# Patient Record
Sex: Female | Born: 1985 | Race: White | Hispanic: Yes | Marital: Married | Smoking: Never smoker
Health system: Southern US, Community
[De-identification: ages and names within clinical notes are randomized; demographics above are authoritative.]

## PROBLEM LIST (undated history)

## (undated) ENCOUNTER — Inpatient Hospital Stay (HOSPITAL_COMMUNITY): Payer: Self-pay

## (undated) DIAGNOSIS — O039 Complete or unspecified spontaneous abortion without complication: Secondary | ICD-10-CM

## (undated) DIAGNOSIS — R519 Headache, unspecified: Secondary | ICD-10-CM

## (undated) DIAGNOSIS — B999 Unspecified infectious disease: Secondary | ICD-10-CM

## (undated) DIAGNOSIS — Z1589 Genetic susceptibility to other disease: Secondary | ICD-10-CM

## (undated) DIAGNOSIS — R011 Cardiac murmur, unspecified: Secondary | ICD-10-CM

## (undated) DIAGNOSIS — R51 Headache: Secondary | ICD-10-CM

## (undated) DIAGNOSIS — J40 Bronchitis, not specified as acute or chronic: Secondary | ICD-10-CM

## (undated) DIAGNOSIS — E039 Hypothyroidism, unspecified: Secondary | ICD-10-CM

## (undated) DIAGNOSIS — J45909 Unspecified asthma, uncomplicated: Secondary | ICD-10-CM

## (undated) DIAGNOSIS — D649 Anemia, unspecified: Secondary | ICD-10-CM

## (undated) HISTORY — PX: WISDOM TOOTH EXTRACTION: SHX21

## (undated) HISTORY — DX: Hypothyroidism, unspecified: E03.9

---

## 2011-03-19 ENCOUNTER — Ambulatory Visit (INDEPENDENT_AMBULATORY_CARE_PROVIDER_SITE_OTHER): Payer: BC Managed Care – PPO

## 2011-03-19 DIAGNOSIS — J209 Acute bronchitis, unspecified: Secondary | ICD-10-CM

## 2011-03-19 DIAGNOSIS — R05 Cough: Secondary | ICD-10-CM

## 2011-03-19 DIAGNOSIS — M7150 Other bursitis, not elsewhere classified, unspecified site: Secondary | ICD-10-CM

## 2011-03-19 DIAGNOSIS — R059 Cough, unspecified: Secondary | ICD-10-CM

## 2011-03-19 DIAGNOSIS — M25539 Pain in unspecified wrist: Secondary | ICD-10-CM

## 2012-03-07 NOTE — L&D Delivery Note (Signed)
SVD of VMI APGARs 9, 9.  EBL: 400cc.  Placenta to L&D.   Head delivered LOA and body delivered atraumatically.  Mouth and nose bulb suctioned.  Cord clamped, cut and baby to abdomen.  Cord pH obtained.  Placenta delivered S/I/3VC.  Fundus firmed with pitocin and massage.  Bilateral vaginal lacerations repaired with 3-0 Vicryl in the normal fashion.  Mom and baby stable.

## 2012-05-07 LAB — OB RESULTS CONSOLE HIV ANTIBODY (ROUTINE TESTING): HIV: NONREACTIVE

## 2012-05-07 LAB — OB RESULTS CONSOLE RPR: RPR: NONREACTIVE

## 2012-05-07 LAB — OB RESULTS CONSOLE RUBELLA ANTIBODY, IGM: Rubella: IMMUNE

## 2012-05-21 LAB — OB RESULTS CONSOLE GC/CHLAMYDIA: Gonorrhea: NEGATIVE

## 2012-07-12 ENCOUNTER — Ambulatory Visit: Payer: BC Managed Care – PPO | Attending: Obstetrics and Gynecology | Admitting: Physical Therapy

## 2012-07-12 DIAGNOSIS — M545 Low back pain, unspecified: Secondary | ICD-10-CM | POA: Insufficient documentation

## 2012-07-12 DIAGNOSIS — M899 Disorder of bone, unspecified: Secondary | ICD-10-CM | POA: Insufficient documentation

## 2012-07-12 DIAGNOSIS — IMO0001 Reserved for inherently not codable concepts without codable children: Secondary | ICD-10-CM | POA: Insufficient documentation

## 2012-07-12 DIAGNOSIS — M25559 Pain in unspecified hip: Secondary | ICD-10-CM | POA: Insufficient documentation

## 2012-07-12 DIAGNOSIS — O99891 Other specified diseases and conditions complicating pregnancy: Secondary | ICD-10-CM | POA: Insufficient documentation

## 2012-07-12 DIAGNOSIS — M259 Joint disorder, unspecified: Secondary | ICD-10-CM | POA: Insufficient documentation

## 2012-07-18 ENCOUNTER — Ambulatory Visit: Payer: BC Managed Care – PPO | Admitting: Physical Therapy

## 2012-07-23 ENCOUNTER — Ambulatory Visit: Payer: BC Managed Care – PPO | Admitting: Physical Therapy

## 2012-08-02 ENCOUNTER — Ambulatory Visit: Payer: BC Managed Care – PPO | Admitting: Physical Therapy

## 2012-08-03 ENCOUNTER — Encounter: Payer: BC Managed Care – PPO | Admitting: Physical Therapy

## 2012-08-07 ENCOUNTER — Ambulatory Visit: Payer: BC Managed Care – PPO | Attending: Obstetrics and Gynecology | Admitting: Physical Therapy

## 2012-08-07 DIAGNOSIS — M259 Joint disorder, unspecified: Secondary | ICD-10-CM | POA: Insufficient documentation

## 2012-08-07 DIAGNOSIS — IMO0001 Reserved for inherently not codable concepts without codable children: Secondary | ICD-10-CM | POA: Insufficient documentation

## 2012-08-07 DIAGNOSIS — M25559 Pain in unspecified hip: Secondary | ICD-10-CM | POA: Insufficient documentation

## 2012-08-07 DIAGNOSIS — M545 Low back pain, unspecified: Secondary | ICD-10-CM | POA: Insufficient documentation

## 2012-08-07 DIAGNOSIS — M899 Disorder of bone, unspecified: Secondary | ICD-10-CM | POA: Insufficient documentation

## 2012-08-08 ENCOUNTER — Encounter: Payer: BC Managed Care – PPO | Admitting: Physical Therapy

## 2012-08-15 ENCOUNTER — Ambulatory Visit: Payer: BC Managed Care – PPO | Admitting: Physical Therapy

## 2012-11-16 LAB — OB RESULTS CONSOLE GBS: GBS: NEGATIVE

## 2012-12-16 ENCOUNTER — Inpatient Hospital Stay (HOSPITAL_COMMUNITY)
Admission: AD | Admit: 2012-12-16 | Discharge: 2012-12-19 | DRG: 373 | Disposition: A | Discharge status: Home / Self Care | Payer: BC Managed Care – PPO | Source: Ambulatory Visit | Attending: Obstetrics and Gynecology | Admitting: Obstetrics and Gynecology

## 2012-12-16 ENCOUNTER — Inpatient Hospital Stay (HOSPITAL_COMMUNITY)
Admission: AD | Admit: 2012-12-16 | Discharge: 2012-12-16 | Disposition: A | Discharge status: Home / Self Care | Payer: BC Managed Care – PPO | Source: Ambulatory Visit | Attending: Obstetrics and Gynecology | Admitting: Obstetrics and Gynecology

## 2012-12-16 ENCOUNTER — Encounter (HOSPITAL_COMMUNITY): Payer: Self-pay

## 2012-12-16 ENCOUNTER — Encounter (HOSPITAL_COMMUNITY): Payer: Self-pay | Admitting: *Deleted

## 2012-12-16 DIAGNOSIS — O479 False labor, unspecified: Secondary | ICD-10-CM | POA: Insufficient documentation

## 2012-12-16 HISTORY — DX: Bronchitis, not specified as acute or chronic: J40

## 2012-12-16 HISTORY — DX: Unspecified asthma, uncomplicated: J45.909

## 2012-12-16 HISTORY — DX: Cardiac murmur, unspecified: R01.1

## 2012-12-16 LAB — CBC
MCH: 30.2 pg (ref 26.0–34.0)
MCHC: 34.7 g/dL (ref 30.0–36.0)
MCV: 87 fL (ref 78.0–100.0)
Platelets: 176 10*3/uL (ref 150–400)
RBC: 4.24 MIL/uL (ref 3.87–5.11)
RDW: 13 % (ref 11.5–15.5)
WBC: 13 10*3/uL — ABNORMAL HIGH (ref 4.0–10.5)

## 2012-12-16 MED ORDER — ACETAMINOPHEN 325 MG PO TABS: 650.0000 mg | ORAL_TABLET | ORAL | Status: DC | PRN

## 2012-12-16 MED ORDER — CITRIC ACID-SODIUM CITRATE 334-500 MG/5ML PO SOLN: 30.0000 mL | ORAL | Status: DC | PRN

## 2012-12-16 MED ORDER — LACTATED RINGERS IV SOLN
INTRAVENOUS | Status: DC
  Administered 2012-12-17 (×2): 125 mL/h via INTRAVENOUS

## 2012-12-16 MED ORDER — OXYCODONE-ACETAMINOPHEN 5-325 MG PO TABS: 1.0000 | ORAL_TABLET | ORAL | Status: DC | PRN

## 2012-12-16 MED ORDER — OXYTOCIN BOLUS FROM INFUSION: 500.0000 mL | INTRAVENOUS | Status: DC

## 2012-12-16 MED ORDER — IBUPROFEN 600 MG PO TABS: 600.0000 mg | ORAL_TABLET | Freq: Four times a day (QID) | ORAL | Status: DC | PRN

## 2012-12-16 MED ORDER — OXYTOCIN 40 UNITS IN LACTATED RINGERS INFUSION - SIMPLE MED
62.5000 mL/h | INTRAVENOUS | Status: DC
  Administered 2012-12-17 (×2): 62.5 mL/h via INTRAVENOUS
  Filled 2012-12-16: qty 1000

## 2012-12-16 MED ORDER — ONDANSETRON HCL 4 MG/2ML IJ SOLN
4.0000 mg | Freq: Four times a day (QID) | INTRAMUSCULAR | Status: DC | PRN
  Administered 2012-12-16: 4 mg via INTRAVENOUS
  Filled 2012-12-16: qty 2

## 2012-12-16 MED ORDER — LACTATED RINGERS IV SOLN
500.0000 mL | INTRAVENOUS | Status: DC | PRN
  Administered 2012-12-16: 500 mL via INTRAVENOUS
  Administered 2012-12-17: 300 mL via INTRAVENOUS

## 2012-12-16 MED ORDER — BUTORPHANOL TARTRATE 1 MG/ML IJ SOLN
1.0000 mg | INTRAMUSCULAR | Status: DC | PRN
  Administered 2012-12-16: 1 mg via INTRAVENOUS
  Filled 2012-12-16: qty 1

## 2012-12-16 MED ORDER — LIDOCAINE HCL (PF) 1 % IJ SOLN: 30.0000 mL | INTRAMUSCULAR | Status: DC | PRN

## 2012-12-17 ENCOUNTER — Encounter (HOSPITAL_COMMUNITY): Payer: Self-pay | Admitting: Anesthesiology

## 2012-12-17 ENCOUNTER — Inpatient Hospital Stay (HOSPITAL_COMMUNITY): Payer: BC Managed Care – PPO | Admitting: Anesthesiology

## 2012-12-17 ENCOUNTER — Encounter (HOSPITAL_COMMUNITY): Payer: BC Managed Care – PPO | Admitting: Anesthesiology

## 2012-12-17 LAB — RPR: RPR Ser Ql: NONREACTIVE

## 2012-12-17 MED ORDER — ZOLPIDEM TARTRATE 5 MG PO TABS: 5.0000 mg | ORAL_TABLET | Freq: Every evening | ORAL | Status: DC | PRN

## 2012-12-17 MED ORDER — LANOLIN HYDROUS EX OINT: TOPICAL_OINTMENT | CUTANEOUS | Status: DC | PRN

## 2012-12-17 MED ORDER — OXYCODONE-ACETAMINOPHEN 5-325 MG PO TABS
1.0000 | ORAL_TABLET | ORAL | Status: DC | PRN
  Administered 2012-12-18 – 2012-12-19 (×3): 1 via ORAL
  Filled 2012-12-17 (×3): qty 1

## 2012-12-17 MED ORDER — DIBUCAINE 1 % RE OINT
1.0000 "application " | TOPICAL_OINTMENT | RECTAL | Status: DC | PRN
  Filled 2012-12-17: qty 28

## 2012-12-17 MED ORDER — FENTANYL 2.5 MCG/ML BUPIVACAINE 1/10 % EPIDURAL INFUSION (WH - ANES)
14.0000 mL/h | INTRAMUSCULAR | Status: DC | PRN
  Administered 2012-12-17: 14 mL/h via EPIDURAL
  Filled 2012-12-17 (×2): qty 125

## 2012-12-17 MED ORDER — SENNOSIDES-DOCUSATE SODIUM 8.6-50 MG PO TABS
2.0000 | ORAL_TABLET | ORAL | Status: DC
  Administered 2012-12-17 – 2012-12-18 (×2): 2 via ORAL
  Filled 2012-12-17 (×2): qty 2

## 2012-12-17 MED ORDER — IBUPROFEN 600 MG PO TABS
600.0000 mg | ORAL_TABLET | Freq: Four times a day (QID) | ORAL | Status: DC
  Administered 2012-12-17 – 2012-12-19 (×7): 600 mg via ORAL
  Filled 2012-12-17 (×7): qty 1

## 2012-12-17 MED ORDER — BENZOCAINE-MENTHOL 20-0.5 % EX AERO
1.0000 "application " | INHALATION_SPRAY | CUTANEOUS | Status: DC | PRN
  Administered 2012-12-17: 1 via TOPICAL
  Filled 2012-12-17 (×2): qty 56

## 2012-12-17 MED ORDER — PHENYLEPHRINE 40 MCG/ML (10ML) SYRINGE FOR IV PUSH (FOR BLOOD PRESSURE SUPPORT): 80.0000 ug | PREFILLED_SYRINGE | INTRAVENOUS | Status: DC | PRN

## 2012-12-17 MED ORDER — FENTANYL 2.5 MCG/ML BUPIVACAINE 1/10 % EPIDURAL INFUSION (WH - ANES)
INTRAMUSCULAR | Status: DC | PRN
  Administered 2012-12-17: 14 mL/h via EPIDURAL

## 2012-12-17 MED ORDER — PHENYLEPHRINE 40 MCG/ML (10ML) SYRINGE FOR IV PUSH (FOR BLOOD PRESSURE SUPPORT)
80.0000 ug | PREFILLED_SYRINGE | INTRAVENOUS | Status: DC | PRN
  Filled 2012-12-17: qty 5

## 2012-12-17 MED ORDER — EPHEDRINE 5 MG/ML INJ
10.0000 mg | INTRAVENOUS | Status: AC | PRN
  Administered 2012-12-17 (×2): 10 mg via INTRAVENOUS

## 2012-12-17 MED ORDER — DIPHENHYDRAMINE HCL 50 MG/ML IJ SOLN: 12.5000 mg | INTRAMUSCULAR | Status: DC | PRN

## 2012-12-17 MED ORDER — ONDANSETRON HCL 4 MG PO TABS: 4.0000 mg | ORAL_TABLET | ORAL | Status: DC | PRN

## 2012-12-17 MED ORDER — DIPHENHYDRAMINE HCL 25 MG PO CAPS: 25.0000 mg | ORAL_CAPSULE | Freq: Four times a day (QID) | ORAL | Status: DC | PRN

## 2012-12-17 MED ORDER — SIMETHICONE 80 MG PO CHEW: 80.0000 mg | CHEWABLE_TABLET | ORAL | Status: DC | PRN

## 2012-12-17 MED ORDER — TETANUS-DIPHTH-ACELL PERTUSSIS 5-2.5-18.5 LF-MCG/0.5 IM SUSP: 0.5000 mL | Freq: Once | INTRAMUSCULAR | Status: DC

## 2012-12-17 MED ORDER — WITCH HAZEL-GLYCERIN EX PADS: 1.0000 "application " | MEDICATED_PAD | CUTANEOUS | Status: DC | PRN

## 2012-12-17 MED ORDER — LACTATED RINGERS IV SOLN
500.0000 mL | Freq: Once | INTRAVENOUS | Status: AC
  Administered 2012-12-17: 500 mL via INTRAVENOUS

## 2012-12-17 MED ORDER — EPHEDRINE 5 MG/ML INJ
10.0000 mg | INTRAVENOUS | Status: DC | PRN
  Filled 2012-12-17 (×2): qty 4

## 2012-12-17 MED ORDER — ONDANSETRON HCL 4 MG/2ML IJ SOLN: 4.0000 mg | INTRAMUSCULAR | Status: DC | PRN

## 2012-12-17 MED ORDER — PRENATAL MULTIVITAMIN CH
1.0000 | ORAL_TABLET | Freq: Every day | ORAL | Status: DC
  Administered 2012-12-17 – 2012-12-18 (×2): 1 via ORAL
  Filled 2012-12-17 (×2): qty 1

## 2012-12-17 MED ORDER — LIDOCAINE HCL (PF) 1 % IJ SOLN
INTRAMUSCULAR | Status: DC | PRN
  Administered 2012-12-17 (×2): 9 mL

## 2012-12-17 NOTE — H&P (Signed)
Wendy Osborne is a 27 y.o. female presenting for labor. History OB History   Grav Para Term Preterm Abortions TAB SAB Ect Mult Living   1              Past Medical History  Diagnosis Date  . Bronchitis   . Heart murmur     middle school heart murmer noted, gone now  . Asthma     inhaler prn   Past Surgical History  Procedure Laterality Date  . Wisdom tooth extraction     Family History: family history includes Cancer in her brother and father. Social History:  reports that she has never smoked. She does not have any smokeless tobacco history on file. She reports that she does not drink alcohol or use illicit drugs.   Prenatal Transfer Tool  Maternal Diabetes: No Genetic Screening: Normal Maternal Ultrasounds/Referrals: Normal Fetal Ultrasounds or other Referrals:  None Maternal Substance Abuse:  No Significant Maternal Medications:  None Significant Maternal Lab Results:  None Other Comments:  None  ROS  Dilation: 3 Effacement (%): 90 Station: 0 Exam by:: T. Sprague RN Blood pressure 108/58, pulse 71, temperature 98.4 F (36.9 C), temperature source Oral, resp. rate 18, height 4\' 11"  (1.499 m), weight 71.668 kg (158 lb), SpO2 100.00%. Exam Physical Exam  Gen - uncomfortable w/ ctx Abd - gravid, NT Ext - NT Cvx 3/90/-1 Prenatal labs: ABO, Rh: O/Positive/-- (03/03 0000) Antibody: Negative (03/03 0000) Rubella: Immune (03/03 0000) RPR: Nonreactive (03/03 0000)  HBsAg: Negative (03/03 0000)  HIV: Non-reactive (03/03 0000)  GBS: Negative (09/12 0000)   Assessment/Plan: Labor Epidural prn   Kyran Connaughton 12/17/2012, 1:57 AM

## 2012-12-17 NOTE — Anesthesia Postprocedure Evaluation (Signed)
  Anesthesia Post-op Note  Patient: Wendy Osborne  Procedure(s) Performed: * No procedures listed *  Patient Location: Mother/Baby  Anesthesia Type:Epidural  Level of Consciousness: awake, alert  and oriented  Airway and Oxygen Therapy: Patient Spontanous Breathing  Post-op Pain: none  Post-op Assessment: Post-op Vital signs reviewed and Patient's Cardiovascular Status Stable  Post-op Vital Signs: Reviewed and stable  Complications: No apparent anesthesia complications

## 2012-12-17 NOTE — Progress Notes (Signed)
Pt comfortable w/ epidural  FHT reassuring, cat 1 Toco Q2-4 Cvx 6/C/-1-0 AROM, clear  A/P:  Exp mngt

## 2012-12-17 NOTE — Anesthesia Preprocedure Evaluation (Signed)
Anesthesia Evaluation  Patient identified by MRN, date of birth, ID band Patient awake    Reviewed: Allergy & Precautions, H&P , Patient's Chart, lab work & pertinent test results  Airway Mallampati: II TM Distance: >3 FB Neck ROM: full    Dental no notable dental hx.    Pulmonary    Pulmonary exam normal       Cardiovascular negative cardio ROS      Neuro/Psych negative neurological ROS  negative psych ROS   GI/Hepatic negative GI ROS, Neg liver ROS,   Endo/Other  negative endocrine ROS  Renal/GU negative Renal ROS  negative genitourinary   Musculoskeletal negative musculoskeletal ROS (+)   Abdominal Normal abdominal exam  (+)   Peds  Hematology negative hematology ROS (+)   Anesthesia Other Findings   Reproductive/Obstetrics (+) Pregnancy                           Anesthesia Physical Anesthesia Plan  ASA: II  Anesthesia Plan: Epidural   Post-op Pain Management:    Induction:   Airway Management Planned:   Additional Equipment:   Intra-op Plan:   Post-operative Plan:   Informed Consent: I have reviewed the patients History and Physical, chart, labs and discussed the procedure including the risks, benefits and alternatives for the proposed anesthesia with the patient or authorized representative who has indicated his/her understanding and acceptance.     Plan Discussed with:   Anesthesia Plan Comments:         Anesthesia Quick Evaluation

## 2012-12-17 NOTE — Anesthesia Procedure Notes (Signed)
Epidural Patient location during procedure: OB Start time: 12/17/2012 12:32 AM End time: 12/17/2012 12:36 AM  Staffing Anesthesiologist: Sandrea Hughs Performed by: anesthesiologist   Preanesthetic Checklist Completed: patient identified, surgical consent, pre-op evaluation, timeout performed, IV checked, risks and benefits discussed and monitors and equipment checked  Epidural Patient position: sitting Prep: site prepped and draped and DuraPrep Patient monitoring: continuous pulse ox and blood pressure Approach: midline Injection technique: LOR air  Needle:  Needle type: Tuohy  Needle gauge: 17 G Needle length: 9 cm and 9 Needle insertion depth: 5 cm cm Catheter type: closed end flexible Catheter size: 19 Gauge Catheter at skin depth: 10 cm Test dose: negative and Other  Assessment Sensory level: T9 Events: blood not aspirated, injection not painful, no injection resistance, negative IV test and no paresthesia  Additional Notes Reason for block:procedure for pain

## 2012-12-18 LAB — CBC
HCT: 25.8 % — ABNORMAL LOW (ref 36.0–46.0)
Hemoglobin: 9.2 g/dL — ABNORMAL LOW (ref 12.0–15.0)
MCH: 31.6 pg (ref 26.0–34.0)
MCHC: 35.7 g/dL (ref 30.0–36.0)
MCV: 88.7 fL (ref 78.0–100.0)
Platelets: 135 K/uL — ABNORMAL LOW (ref 150–400)
RBC: 2.91 MIL/uL — ABNORMAL LOW (ref 3.87–5.11)
RDW: 13.3 % (ref 11.5–15.5)
WBC: 11.2 K/uL — ABNORMAL HIGH (ref 4.0–10.5)

## 2012-12-18 NOTE — Progress Notes (Signed)
Post Partum Day 1 Subjective: no complaints, up ad lib, voiding, tolerating PO and + flatus  Objective: Blood pressure 85/44, pulse 81, temperature 98.6 F (37 C), temperature source Oral, resp. rate 18, height 4\' 11"  (1.499 m), weight 158 lb (71.668 kg), SpO2 99.00%, unknown if currently breastfeeding.  Physical Exam:  General: alert and cooperative Lochia: appropriate Uterine Fundus: firm Incision: perineum intact DVT Evaluation: No evidence of DVT seen on physical exam. Negative Homan's sign. No cords or calf tenderness. No significant calf/ankle edema.   Recent Labs  12/16/12 2200 12/18/12 0610  HGB 12.8 9.2*  HCT 36.9 25.8*    Assessment/Plan: Plan for discharge tomorrow and Circumcision prior to discharge   LOS: 2 days   Charma Mocarski G 12/18/2012, 8:05 AM

## 2012-12-19 MED ORDER — OXYCODONE-ACETAMINOPHEN 5-325 MG PO TABS: 1.0000 | ORAL_TABLET | ORAL | Status: DC | PRN

## 2012-12-19 MED ORDER — IBUPROFEN 600 MG PO TABS: 600.0000 mg | ORAL_TABLET | Freq: Four times a day (QID) | ORAL | Status: DC

## 2012-12-19 NOTE — Discharge Summary (Signed)
Obstetric Discharge Summary Reason for Admission: onset of labor Prenatal Procedures: ultrasound Intrapartum Procedures: spontaneous vaginal delivery Postpartum Procedures: none Complications-Operative and Postpartum: vaginal laceration Hemoglobin  Date Value Range Status  12/18/2012 9.2* 12.0 - 15.0 g/dL Final     DELTA CHECK NOTED     REPEATED TO VERIFY     HCT  Date Value Range Status  12/18/2012 25.8* 36.0 - 46.0 % Final    Physical Exam:  General: alert and cooperative Lochia: appropriate Uterine Fundus: firm Incision: perineum intact DVT Evaluation: No evidence of DVT seen on physical exam. Negative Homan's sign. No cords or calf tenderness. No significant calf/ankle edema.  Discharge Diagnoses: Term Pregnancy-delivered  Discharge Information: Date: 12/19/2012 Activity: pelvic rest Diet: routine Medications: PNV, Ibuprofen and Percocet Condition: stable Instructions: refer to practice specific booklet Discharge to: home   Newborn Data: Live born female  Birth Weight: 7 lb 1.6 oz (3221 g) APGAR: 9, 9  Home with mother.  Marchelle Rinella G 12/19/2012, 8:09 AM

## 2012-12-19 NOTE — Lactation Note (Addendum)
This note was copied from the chart of Wendy Osborne. Lactation Consultation Note  Patient Name: Wendy Leticia Coletta [id number] Date: 12/19/2012 Reason for consult: Follow-up assessment;Breast/nipple pain Mom has bruising on right nipple, bruising and cracking on left nipple. Wearing shells, but some swelling noted on right nipple. Had Mom pre-pump, Mom latched baby to right breast in cross cradle with minimal assist without nipple shield. Baby demonstrated a good rhythmic suck with swallows noted. Mom reports less discomfort starting last night and tolerated this feeding well. Nipple was round when baby came off the breast. On exam, baby does have good tongue movement noted. Some dimpling with extension of tongue. Engorgement care reviewed if needed. OP appt. Scheduled for Tuesday, Oct. 21 at 10:30. Advised of support group.   Maternal Data    Feeding Feeding Type: Breast Fed Length of feed: 20 min  LATCH Score/Interventions Latch: Grasps breast easily, tongue down, lips flanged, rhythmical sucking. Intervention(s): Assist with latch;Breast compression;Breast massage  Audible Swallowing: Spontaneous and intermittent  Type of Nipple: Everted at rest and after stimulation (short nipple shaft)  Comfort (Breast/Nipple): Filling, red/small blisters or bruises, mild/mod discomfort Problem noted: Cracked, bleeding, blisters, bruises Intervention(s): Expressed breast milk to nipple;Reverse pressure  Problem noted: Mild/Moderate discomfort Interventions (Mild/moderate discomfort): Comfort gels;Hand expression  Hold (Positioning): Assistance needed to correctly position infant at breast and maintain latch. (minimal assist) Intervention(s): Breastfeeding basics reviewed;Support Pillows;Position options;Skin to skin  LATCH Score: 8  Lactation Tools Discussed/Used Tools: Pump;Nipple Dorris Carnes;Shells;Comfort gels Nipple shield size: 20;24 Shell Type: Inverted Breast pump type:  Double-Electric Breast Pump   Consult Status Consult Status: Complete Date: 12/19/12 Follow-up type: In-patient    Alfred Levins 12/19/2012, 10:19 AM

## 2012-12-25 ENCOUNTER — Ambulatory Visit (HOSPITAL_COMMUNITY)
Admit: 2012-12-25 | Discharge: 2012-12-25 | Disposition: A | Discharge status: Home / Self Care | Payer: BC Managed Care – PPO | Attending: Obstetrics and Gynecology | Admitting: Obstetrics and Gynecology

## 2012-12-25 NOTE — Lactation Note (Signed)
Adult Lactation Consultation Outpatient Visit Note :   Patient Name: Wendy Osborne                                   " [name]"   BW,7-1, DOB [birth year]                                        Date of Birth: 21-Mar-1985                                                    4-7,8295 Gestational Age at Delivery: [redacted]w[redacted]d                                  23 days old Type of Delivery:   Breastfeeding History: Frequency of Breastfeeding: every 2-3 hours Length of Feeding: 60-90 mins Voids: 10  Stools: 10 yellow seedy  Supplementing / Method: Hand pump Pumping:  Type of Pump:after each feeding for 10 mins   Frequency:6-8 times daily  Volume:  15-30 ml  Comments:Mother was scheduled for this feeding assessment on day of discharge. She states that infant was very hard to wake for feedings the first several days. She states now her breastfeeds for 60-90 mins and he then breastfeeds again within the hour. Mother is very exhausted. She does have good support.     Consultation Evaluation:Mothers breast are firm and full. Mother pointed out that she a very tender area on (R) outer aspect of (R) breast , approx. 9 cm in size. Area is warm to touch. When ask mother states she has had aches and chills for more than 24 hours. She denies having a fever. She has a small scab on top of (L) areola. She also has bilateral cracking. (R) nipple is worse. She states she is unable to tolerate infant feeding on (R) without a nipple shield. Mother is using a #24 nipple shield.   Assist mother with latching infant on (L) breast in football position . Infant sustained latch for 22  Mins. Infant transferred 40 ml. Infant was placed on (R) with a #24 nipple shield,( mother applied nipple shield well with good technique). Infant sustained latch for 25 mins and transferred another 46ml.   Initial Feeding Assessment: Pre-feed Weight:3308 Post-feed AOZHYQ:6578 Amount Transferred:40 ml Comments:  Additional Feeding  Assessment: Pre-feed IONGEX:5284 Post-feed Weight:3322 Amount Transferred:46 ml Comments:  Additional Feeding Assessment: Pre-feed Weight: Post-feed Weight: Amount Transferred: Comments:  Total Breast milk Transferred this Visit: 86 ml Total Supplement Given:   Additional Interventions: Recommend that mother phone her provider (Dr Henderson Cloud) to be seen for Mastitis or to get antibiotic.  Recommend that mother also request Rx for [place] for cracked nipples. Advised mother to continue to offer breast on cue and to allow infant to completely drain especially (R) breast.  Advised to rotate positions frequently. Encouraged mother to  Pump after each feeding to continue to drain breast and to post pump for at least 15 mins on each side.  Instruct mother to do lite breast massage over affective area and then to apply warm moist compressed every 3-4 hours.  Nap  frequently and allow for lots of assistance in the home.  Mother receptive to all teaching.    Follow-Up  PRN, mother states she will call as needed if Mastitis not resolved.    Stevan Born McCoy 12/25/2012, 11:08 AM

## 2014-01-06 ENCOUNTER — Encounter (HOSPITAL_COMMUNITY): Payer: Self-pay | Admitting: Anesthesiology

## 2015-08-07 ENCOUNTER — Ambulatory Visit (INDEPENDENT_AMBULATORY_CARE_PROVIDER_SITE_OTHER): Payer: BC Managed Care – PPO | Admitting: Physician Assistant

## 2015-08-07 VITALS — BP 110/78 | HR 97 | Temp 98.4°F | Resp 15 | Ht 59.0 in | Wt 125.0 lb

## 2015-08-07 DIAGNOSIS — J029 Acute pharyngitis, unspecified: Secondary | ICD-10-CM | POA: Diagnosis not present

## 2015-08-07 MED ORDER — CLINDAMYCIN HCL 300 MG PO CAPS: 300.0000 mg | ORAL_CAPSULE | Freq: Three times a day (TID) | ORAL | Status: DC

## 2015-08-07 NOTE — Patient Instructions (Signed)
     IF you received an x-ray today, you will receive an invoice from [facility]. Please contact [facility] at [phone] with questions or concerns regarding your invoice.   IF you received labwork today, you will receive an invoice from [facility]. Please contact [facility] at [phone] with questions or concerns regarding your invoice.   Our billing staff will not be able to assist you with questions regarding bills from these companies.  You will be contacted with the lab results as soon as they are available. The fastest way to get your results is to activate your My Chart account. Instructions are located on the last page of this paperwork. If you have not heard from us regarding the results in 2 weeks, please contact this office.      

## 2015-08-07 NOTE — Progress Notes (Signed)
   08/07/2015 1:49 PM   DOB: 1985-11-30 / MRN: [id number]  SUBJECTIVE:  Wendy Osborne is a 30 y.o. female presenting for sore throat, fever (TM 101), and headache that started three days ago.  Reports that her father was recently diagnosed with strep and has taken Abx and is better.  She denies cough. Has tried Ibuprofen for fever and pain which helped.   She is allergic to penicillins.   She  has a past medical history of Bronchitis; Heart murmur; and Asthma.    She  reports that she has never smoked. She does not have any smokeless tobacco history on file. She reports that she does not drink alcohol or use illicit drugs. She  reports that she currently engages in sexual activity. The patient  has past surgical history that includes Wisdom tooth extraction.  Her family history includes Cancer in her brother and father.  Review of Systems  Constitutional: Negative for fever, chills, malaise/fatigue and diaphoresis.  HENT: Positive for sore throat. Negative for congestion.   Respiratory: Negative for cough, hemoptysis, shortness of breath and wheezing.   Cardiovascular: Negative for chest pain.  Gastrointestinal: Negative for nausea.  Skin: Negative for rash.  Neurological: Negative for dizziness and weakness.  Endo/Heme/Allergies: Negative for polydipsia.    Problem list and medications reviewed and updated by myself where necessary, and exist elsewhere in the encounter.   OBJECTIVE:  BP 110/78 mmHg  Pulse 97  Temp(Src) 98.4 F (36.9 C) (Oral)  Resp 15  Ht 4\' 11"  (1.499 m)  Wt 125 lb (56.7 kg)  BMI 25.23 kg/m2  SpO2 98%  LMP 08/06/2015  Physical Exam  Constitutional: She is oriented to person, place, and time. She appears well-nourished. No distress.  HENT:  Right Ear: Tympanic membrane normal.  Left Ear: Tympanic membrane normal.  Nose: Nose normal.  Mouth/Throat:    Eyes: EOM are normal. Pupils are equal, round, and reactive to light.  Cardiovascular: Normal rate.    Pulmonary/Chest: Effort normal.  Abdominal: She exhibits no distension.  Neurological: She is alert and oriented to person, place, and time. No cranial nerve deficit. Gait normal.  Skin: Skin is dry. She is not diaphoretic.  Psychiatric: She has a normal mood and affect.  Vitals reviewed.   No results found for this or any previous visit (from the past 72 hour(s)).  No results found.  ASSESSMENT AND PLAN  Lahoma Rockerlegria was seen today for sore throat, fever and headache.  Diagnoses and all orders for this visit:  Sore throat: Her symptoms are consistent with strep.  Will treat for now and await culture to guide therapy        -     clindamycin (CLEOCIN) 300 MG capsule; Take 1 capsule (300 mg total) by mouth 3 (three) times daily. -     POCT rapid strep A -     Culture, Group A Strep    The patient was advised to call or return to clinic if she does not see an improvement in symptoms or to seek the care of the closest emergency department if she worsens with the above plan.   Deliah BostonMichael Clark, MHS, PA-C [facility] 08/07/2015 1:49 PM

## 2015-08-09 LAB — CULTURE, GROUP A STREP: Organism ID, Bacteria: NORMAL

## 2015-08-12 ENCOUNTER — Encounter: Payer: Self-pay | Admitting: *Deleted

## 2016-04-02 ENCOUNTER — Encounter (HOSPITAL_COMMUNITY): Payer: Self-pay | Admitting: *Deleted

## 2016-04-02 ENCOUNTER — Inpatient Hospital Stay (HOSPITAL_COMMUNITY)
Admission: AD | Admit: 2016-04-02 | Discharge: 2016-04-02 | Disposition: A | Discharge status: Home / Self Care | Payer: BC Managed Care – PPO | Source: Ambulatory Visit | Attending: Obstetrics and Gynecology | Admitting: Obstetrics and Gynecology

## 2016-04-02 DIAGNOSIS — Z3A00 Weeks of gestation of pregnancy not specified: Secondary | ICD-10-CM | POA: Diagnosis not present

## 2016-04-02 DIAGNOSIS — O3680X0 Pregnancy with inconclusive fetal viability, not applicable or unspecified: Secondary | ICD-10-CM

## 2016-04-02 DIAGNOSIS — O0281 Inappropriate change in quantitative human chorionic gonadotropin (hCG) in early pregnancy: Secondary | ICD-10-CM | POA: Diagnosis not present

## 2016-04-02 HISTORY — DX: Unspecified infectious disease: B99.9

## 2016-04-02 HISTORY — DX: Headache: R51

## 2016-04-02 HISTORY — DX: Headache, unspecified: R51.9

## 2016-04-02 HISTORY — DX: Anemia, unspecified: D64.9

## 2016-04-02 LAB — HCG, QUANTITATIVE, PREGNANCY: hCG, Beta Chain, Quant, S: 113 m[IU]/mL — ABNORMAL HIGH (ref <5)

## 2016-04-02 NOTE — Discharge Instructions (Signed)
Methotrexate Treatment for an Ectopic Pregnancy Methotrexate is a medicine that treats ectopic pregnancy by stopping the growth of the fertilized egg. It also helps your body absorb tissue from the egg. This takes between 2 weeks and 6 weeks. Most ectopic pregnancies can be successfully treated with methotrexate if they are detected early enough. LET [facility]ARE PROVIDER KNOW ABOUT:  Any allergies you have.  All medicines you are taking, including vitamins, herbs, eye drops, creams, and over-the-counter medicines.  Medical conditions you have. RISKS AND COMPLICATIONS Generally, this is a safe treatment. However, as with any treatment, problems can occur. Possible problems or side effects include:  Nausea.  Vomiting.  Diarrhea.  Abdominal cramping.  Mouth sores.  Increased vaginal bleeding or spotting.   Swelling or irritation of the lining of your lungs (pneumonitis).  Failed treatment and continuation of the pregnancy.   Liver damage.  Hair loss. There is still a risk of the ectopic pregnancy rupturing while using the methotrexate. BEFORE THE PROCEDURE Before you take the medicine:   Liver tests, kidney tests, and a complete blood test are performed.  Blood tests are performed to measure the pregnancy hormone levels and to determine your blood type.  If you are Rh-negative and the father is Rh-positive or his Rh type is not known, you will be given a Rho (D) immune globulin shot. PROCEDURE  There are two methods that your health care provider may use to prescribe methotrexate. One method involves a single dose or injection of the medicine. Another method involves a series of doses given through several injections.  AFTER THE PROCEDURE  You may have some abdominal cramping, vaginal bleeding, and fatigue in the first few days after taking methotrexate.  Blood tests will be taken for several weeks to check the pregnancy hormone levels. The blood tests are performed  until there is no more pregnancy hormone detected in the blood. This information is not intended to replace advice given to you by your health care provider. Make sure you discuss any questions you have with your health care provider. Document Released: 02/15/2001 Document Revised: 03/14/2014 Document Reviewed: 12/10/2012 Elsevier Interactive Patient Education  2017 [employer]

## 2016-04-02 NOTE — MAU Note (Signed)
Beginning of the month, thought she had a normal period.  Did an ovulation predictor test last wk, was + and this was earlier than expected., did a preg test- was +.  Went to office on Tues, HCG 91, THur was 98.   On Tues, could not see anything on US.  Brown spotting Mon- Wed

## 2016-04-02 NOTE — MAU Provider Note (Signed)
S:  Ms.Arabelle Sherlon HandingRodriguez is a 31 y.o. female 262P1001 @ Unknown here in MAU for a follow up beta hcg level. She has been followed in the office this week for hcg levels. Per the patient her Sharene Buttersquant are as follows:  1/23:91 1/25: 98 1/27: 113 (today in MAU)  She denies pain and her spotting stopped yesterday.    O:  GENERAL: Well-developed, well-nourished female in no acute distress.  LUNGS: Effort normal SKIN: Warm, dry and without erythema PSYCH: Normal mood and affect  Vitals:   04/02/16 0852 04/02/16 0856  BP: 99/55   Pulse: 80   Resp: 16   Temp: 98.2 F (36.8 C)   TempSrc: Oral   Weight:  128 lb 6.4 oz (58.2 kg)  Height:  4' 10.5" (1.486 m)    MDM:  Discussed labs with Dr. Elon SpannerLeger. Very desired pregnancy. Ok for patient to follow up in the office on Monday for an additional quant. Will likely offer MTX on Monday in the office. Prepared patient with the likely hood of MTX treatment for pregnancy of unknown location and abnormal rise in quants X 3. Patient tearful, however very understanding. After long discussion with her significant other over the phone, the patient feels it would be best to go to the office on Monday for an additional quant and go from there.    A:  1. Inappropriate change in quantitative hCG in early pregnancy   2. Pregnancy of unknown anatomic location     P:  Discharge home in stable condition Call the office on Monday morning to schedule beta hcg level Strict return precautions Support given Pelvic rest Ectopic precautions   Duane LopeJennifer I Rasch, NP 04/02/2016 3:06 PM

## 2016-04-09 ENCOUNTER — Encounter (HOSPITAL_COMMUNITY): Payer: Self-pay | Admitting: *Deleted

## 2016-04-09 ENCOUNTER — Ambulatory Visit (HOSPITAL_COMMUNITY)
Admission: EM | Admit: 2016-04-09 | Discharge: 2016-04-09 | Disposition: A | Discharge status: Home / Self Care | Payer: BC Managed Care – PPO | Attending: Family Medicine | Admitting: Family Medicine

## 2016-04-09 DIAGNOSIS — J111 Influenza due to unidentified influenza virus with other respiratory manifestations: Secondary | ICD-10-CM

## 2016-04-09 DIAGNOSIS — R69 Illness, unspecified: Secondary | ICD-10-CM | POA: Diagnosis not present

## 2016-04-09 HISTORY — DX: Complete or unspecified spontaneous abortion without complication: O03.9

## 2016-04-09 MED ORDER — BENZONATATE 100 MG PO CAPS: 100.0000 mg | ORAL_CAPSULE | Freq: Three times a day (TID) | ORAL | 0 refills | Status: DC

## 2016-04-09 MED ORDER — OSELTAMIVIR PHOSPHATE 75 MG PO CAPS: 75.0000 mg | ORAL_CAPSULE | Freq: Two times a day (BID) | ORAL | 0 refills | Status: DC

## 2016-04-09 NOTE — ED Triage Notes (Signed)
Woke up this AM with non-productive cough; this afternoon had temp 100.5.  Has not taken any meds.

## 2016-04-09 NOTE — Discharge Instructions (Signed)
You most likely have a flu like illness I advise rest, plenty of fluids and management of symptoms with over the counter medicines. For symptoms you may take Tylenol as needed every 4-6 hours for body aches or fever, not to exceed 4,000 mg a day, Take mucinex or mucinex DM ever 12 hours with a full glass of water, you may use an inhaled steroid such as Flonase, 2 sprays each nostril once a day for congestion, or an  antihistamine such as Claritin or Zyrtec once a day.   In addition to the treatments above I have prescribed Tamiflu, take 1 tablet twice a day for 5 days. For cough I have prescribed Tessalon, take 1 tablet every 8 hours as needed.  Should your symptoms worsen or fail to resolve, follow up with your primary care provider or return to clinic.
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
## 2016-07-29 ENCOUNTER — Other Ambulatory Visit (HOSPITAL_COMMUNITY): Payer: Self-pay | Admitting: Obstetrics and Gynecology

## 2016-07-29 DIAGNOSIS — N96 Recurrent pregnancy loss: Secondary | ICD-10-CM

## 2016-08-04 ENCOUNTER — Ambulatory Visit (HOSPITAL_COMMUNITY)
Admission: RE | Admit: 2016-08-04 | Discharge: 2016-08-04 | Disposition: A | Discharge status: Home / Self Care | Payer: BC Managed Care – PPO | Source: Ambulatory Visit | Attending: Obstetrics and Gynecology | Admitting: Obstetrics and Gynecology

## 2016-08-04 ENCOUNTER — Encounter (HOSPITAL_COMMUNITY): Payer: Self-pay | Admitting: Radiology

## 2016-08-04 ENCOUNTER — Other Ambulatory Visit (HOSPITAL_COMMUNITY): Payer: Self-pay | Admitting: Obstetrics and Gynecology

## 2016-08-04 ENCOUNTER — Encounter (HOSPITAL_COMMUNITY): Payer: Self-pay

## 2016-08-04 DIAGNOSIS — N96 Recurrent pregnancy loss: Secondary | ICD-10-CM

## 2016-08-04 MED ORDER — IOPAMIDOL (ISOVUE-300) INJECTION 61%
30.0000 mL | Freq: Once | INTRAVENOUS | Status: AC | PRN
  Administered 2016-08-04: 30 mL

## 2016-08-04 MED ORDER — IOPAMIDOL (ISOVUE-300) INJECTION 61%
30.0000 mL | Freq: Once | INTRAVENOUS | Status: AC | PRN
  Administered 2016-08-04: 15 mL

## 2016-10-26 ENCOUNTER — Inpatient Hospital Stay (HOSPITAL_COMMUNITY)
Admission: AD | Admit: 2016-10-26 | Discharge: 2016-10-26 | Disposition: A | Discharge status: Home / Self Care | Payer: BC Managed Care – PPO | Source: Ambulatory Visit | Attending: Obstetrics and Gynecology | Admitting: Obstetrics and Gynecology

## 2016-10-26 ENCOUNTER — Encounter: Payer: Self-pay | Admitting: Advanced Practice Midwife

## 2016-10-26 ENCOUNTER — Inpatient Hospital Stay (HOSPITAL_COMMUNITY): Payer: BC Managed Care – PPO

## 2016-10-26 DIAGNOSIS — O034 Incomplete spontaneous abortion without complication: Secondary | ICD-10-CM

## 2016-10-26 DIAGNOSIS — O26891 Other specified pregnancy related conditions, first trimester: Secondary | ICD-10-CM

## 2016-10-26 DIAGNOSIS — R109 Unspecified abdominal pain: Secondary | ICD-10-CM | POA: Insufficient documentation

## 2016-10-26 DIAGNOSIS — N939 Abnormal uterine and vaginal bleeding, unspecified: Secondary | ICD-10-CM | POA: Diagnosis present

## 2016-10-26 DIAGNOSIS — O209 Hemorrhage in early pregnancy, unspecified: Secondary | ICD-10-CM

## 2016-10-26 LAB — URINALYSIS, ROUTINE W REFLEX MICROSCOPIC
BILIRUBIN URINE: NEGATIVE
Glucose, UA: NEGATIVE mg/dL
Hgb urine dipstick: NEGATIVE
KETONES UR: NEGATIVE mg/dL
LEUKOCYTES UA: NEGATIVE
NITRITE: NEGATIVE
PH: 7 (ref 5.0–8.0)
Protein, ur: NEGATIVE mg/dL
SPECIFIC GRAVITY, URINE: 1.006 (ref 1.005–1.030)

## 2016-10-26 LAB — HCG, QUANTITATIVE, PREGNANCY: hCG, Beta Chain, Quant, S: 19399 m[IU]/mL — ABNORMAL HIGH (ref <5)

## 2016-10-26 LAB — CBC
HCT: 38.1 % (ref 36.0–46.0)
HEMOGLOBIN: 13.3 g/dL (ref 12.0–15.0)
MCH: 30.4 pg (ref 26.0–34.0)
MCHC: 34.9 g/dL (ref 30.0–36.0)
MCV: 87 fL (ref 78.0–100.0)
Platelets: 258 10*3/uL (ref 150–400)
RBC: 4.38 MIL/uL (ref 3.87–5.11)
RDW: 12.6 % (ref 11.5–15.5)
WBC: 8.3 10*3/uL (ref 4.0–10.5)

## 2016-10-26 LAB — POCT PREGNANCY, URINE: Preg Test, Ur: POSITIVE — AB

## 2016-10-26 MED ORDER — HYDROCODONE-ACETAMINOPHEN 5-325 MG PO TABS: 1.0000 | ORAL_TABLET | ORAL | 0 refills | Status: DC | PRN

## 2016-10-26 NOTE — MAU Note (Signed)
Lab called with correction of BHCG. Initial finding wass 67341 corrected value is [id number].

## 2016-10-26 NOTE — MAU Note (Signed)
Pt reports being 7-[redacted] weeks pregnant and having light cramping and spotting intermittently that began around 0900.  Pt reports 2 previous miscarriages.  Denies pain.

## 2016-10-26 NOTE — Discharge Instructions (Signed)
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
## 2016-10-27 LAB — HIV ANTIBODY (ROUTINE TESTING W REFLEX): HIV Screen 4th Generation wRfx: NONREACTIVE

## 2017-03-28 ENCOUNTER — Other Ambulatory Visit: Payer: Self-pay | Admitting: Obstetrics and Gynecology

## 2017-03-28 ENCOUNTER — Other Ambulatory Visit (HOSPITAL_COMMUNITY)
Admission: RE | Admit: 2017-03-28 | Discharge: 2017-03-28 | Disposition: A | Discharge status: Home / Self Care | Payer: BC Managed Care – PPO | Source: Ambulatory Visit | Attending: Obstetrics and Gynecology | Admitting: Obstetrics and Gynecology

## 2017-03-28 DIAGNOSIS — Z124 Encounter for screening for malignant neoplasm of cervix: Secondary | ICD-10-CM | POA: Diagnosis not present

## 2017-03-30 LAB — CYTOLOGY - PAP
Diagnosis: NEGATIVE
HPV (WINDOPATH): NOT DETECTED

## 2017-08-25 ENCOUNTER — Encounter (HOSPITAL_COMMUNITY): Payer: Self-pay

## 2017-11-14 LAB — OB RESULTS CONSOLE ANTIBODY SCREEN: Antibody Screen: NEGATIVE

## 2017-11-14 LAB — OB RESULTS CONSOLE HIV ANTIBODY (ROUTINE TESTING): HIV: NONREACTIVE

## 2017-11-14 LAB — OB RESULTS CONSOLE GC/CHLAMYDIA
CHLAMYDIA, DNA PROBE: NEGATIVE
Gonorrhea: NEGATIVE

## 2017-11-14 LAB — OB RESULTS CONSOLE RPR: RPR: NONREACTIVE

## 2017-11-14 LAB — OB RESULTS CONSOLE ABO/RH: RH Type: POSITIVE

## 2017-11-14 LAB — OB RESULTS CONSOLE HEPATITIS B SURFACE ANTIGEN: HEP B S AG: NEGATIVE

## 2017-11-14 LAB — OB RESULTS CONSOLE RUBELLA ANTIBODY, IGM: RUBELLA: IMMUNE

## 2018-01-01 ENCOUNTER — Other Ambulatory Visit: Payer: Self-pay

## 2018-01-01 ENCOUNTER — Inpatient Hospital Stay (HOSPITAL_COMMUNITY)
Admission: AD | Admit: 2018-01-01 | Discharge: 2018-01-01 | Disposition: A | Discharge status: Home / Self Care | Payer: BC Managed Care – PPO | Source: Ambulatory Visit | Attending: Obstetrics and Gynecology | Admitting: Obstetrics and Gynecology

## 2018-01-01 ENCOUNTER — Inpatient Hospital Stay (HOSPITAL_BASED_OUTPATIENT_CLINIC_OR_DEPARTMENT_OTHER): Payer: BC Managed Care – PPO

## 2018-01-01 ENCOUNTER — Encounter: Payer: Self-pay | Admitting: Student

## 2018-01-01 DIAGNOSIS — R109 Unspecified abdominal pain: Secondary | ICD-10-CM

## 2018-01-01 DIAGNOSIS — J45909 Unspecified asthma, uncomplicated: Secondary | ICD-10-CM | POA: Diagnosis not present

## 2018-01-01 DIAGNOSIS — O26892 Other specified pregnancy related conditions, second trimester: Secondary | ICD-10-CM

## 2018-01-01 DIAGNOSIS — O9A212 Injury, poisoning and certain other consequences of external causes complicating pregnancy, second trimester: Secondary | ICD-10-CM

## 2018-01-01 DIAGNOSIS — Z3A17 17 weeks gestation of pregnancy: Secondary | ICD-10-CM | POA: Diagnosis not present

## 2018-01-01 DIAGNOSIS — Z833 Family history of diabetes mellitus: Secondary | ICD-10-CM | POA: Diagnosis not present

## 2018-01-01 DIAGNOSIS — Z880 Allergy status to penicillin: Secondary | ICD-10-CM | POA: Diagnosis not present

## 2018-01-01 DIAGNOSIS — O99512 Diseases of the respiratory system complicating pregnancy, second trimester: Secondary | ICD-10-CM | POA: Insufficient documentation

## 2018-01-01 DIAGNOSIS — O99012 Anemia complicating pregnancy, second trimester: Secondary | ICD-10-CM | POA: Diagnosis not present

## 2018-01-01 HISTORY — DX: Genetic susceptibility to other disease: Z15.89

## 2018-01-01 LAB — URINALYSIS, ROUTINE W REFLEX MICROSCOPIC
BILIRUBIN URINE: NEGATIVE
Glucose, UA: NEGATIVE mg/dL
Hgb urine dipstick: NEGATIVE
KETONES UR: NEGATIVE mg/dL
Leukocytes, UA: NEGATIVE
NITRITE: NEGATIVE
PH: 7 (ref 5.0–8.0)
PROTEIN: NEGATIVE mg/dL
Specific Gravity, Urine: 1.01 (ref 1.005–1.030)
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
## 2018-01-01 NOTE — Discharge Instructions (Signed)
Return for worsening abdominal pain, leaking watery fluid, or bright red vaginal bleeding.    Preventing Injuries During Pregnancy Trauma is the most common cause of injury and death in pregnant women. This can also result in serious harm to the baby or even death. How can injuries affect my pregnancy? Your baby is protected in the womb (uterus) by a sac filled with fluid (amniotic sac). Your baby can be harmed if there is a direct blow to your abdomen and pelvis. Trauma may be caused by:  Falls. These are more common in the second and third trimester of pregnancy.  Automobile accidents.  Domestic violence or assault.  Severe burns, such as from fire or electricity.  These injuries can result in:  Tearing of your uterus.  The placenta pulling away from the wall of the uterus (placental abruption).  The amniotic sac breaking open (rupture of membranes).  Blockage or decrease in the blood supply to your baby.  Going into labor earlier than expected.  Severe injuries to other parts of your body, such as your brain, spine, heart, lungs, or other organs.  Minor falls and low-impact automobile accidents do not usually harm your baby, even if they cause a little harm to you. What can I do to lower my risk? Safety  Remove slippery rugs and loose objects on the floor. They increase your risk of tripping or slipping.  Wear comfortable shoes that have a good grip on the sole. Do not wear high-heeled shoes.  Always wear your seat belt properly when riding in a car. Use both the lap and shoulder belt, with the lap belt below your abdomen. Always practice safe driving. Do not ride on a motorcycle while pregnant. Activity  Avoid walking on wet or slippery floors.  Do not participate in rough and violent activities or sports.  Avoid high-risk situations and activities such as: ? Lifting heavy pots of boiling or hot liquids. ? Fixing electrical problems. ? Being near fires or starting  fires. General instructions  Take over-the-counter and prescription medicines only as told by your health care provider.  Know your blood type and the father's blood type in case you develop vaginal bleeding or experience an injury for which a blood transfusion is needed.  Spousal abuse can be a serious cause of trauma during pregnancy. If you are a victim of domestic violence or assault: ? Call your local emergency services (911 in the [place] ? Contact the [employer] Violence Hotline for help and support. When should I seek immediate medical care? Get help right away if:  You fall on your abdomen or experience any serious blow to your abdomen.  You develop stiffness in your neck or pain after a fall or from other trauma.  You develop a headache or vision problems after a fall or from other trauma.  You do not feel the baby moving after a fall or trauma, or you feel that the baby is not moving as much as before the fall or trauma.  You have been the victim of domestic violence or any other kind of physical attack.  You have been in a car accident.  You develop vaginal bleeding.  You have fluid leaking from the vagina.  You develop uterine contractions. Symptoms include pelvic cramping, pain, or serious low back pain.  You become weak, faint, or have uncontrolled vomiting after trauma.  You have a serious burn. This includes burns to the face, neck, hands, or genitals, or burns greater than the  size of your palm anywhere else.  Summary  Trauma is the most common cause of injury and death in pregnant women and can also lead to injury or death of the baby.  Falls, automobile accidents, domestic violence or assault, and severe burns can injure you or your baby. Make sure to get medical help right away if you experience any of these during your pregnancy.  Take steps to prevent slips or falls in your home, such as avoiding slippery floors and removing loose rugs.  Always  wear your seat belt properly when riding in a car. Practice safe driving. This information is not intended to replace advice given to you by your health care provider. Make sure you discuss any questions you have with your health care provider. Document Released: 03/31/2004 Document Revised: 03/02/2016 Document Reviewed: 03/02/2016 Elsevier Interactive Patient Education  2017 [employer].
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
## 2018-02-08 IMAGING — US US OB TRANSVAGINAL
1 series · 15 of 28 positions shown · non-contrast
Comparison: None.

CLINICAL DATA: Abdominal pain and vaginal bleeding in first
trimester pregnancy. Gestational age by LMP of 8 weeks 5 days.

EXAM:
OBSTETRIC <14 WK US AND TRANSVAGINAL OB US
TECHNIQUE: Both transabdominal and transvaginal ultrasound examinations were
performed for complete evaluation of the gestation as well as the
maternal uterus, adnexal regions, and pelvic cul-de-sac.
Transvaginal technique was performed to assess early pregnancy.

[Series 1: us ob transvaginal · 15 of 52 slices shown]
[im 1/52]
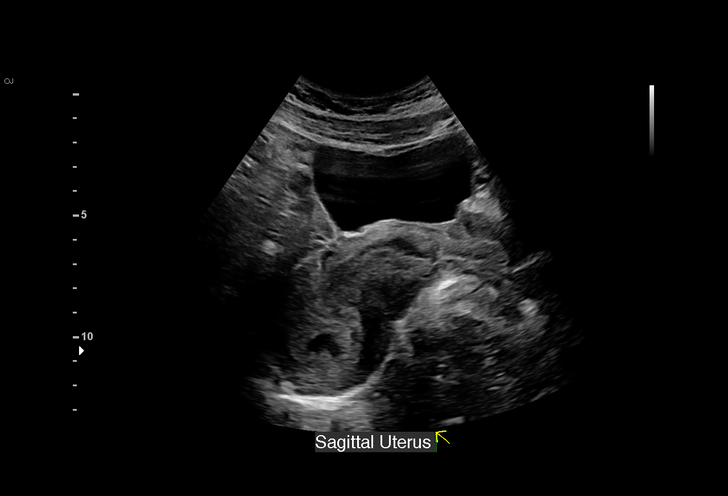
[im 4/52]
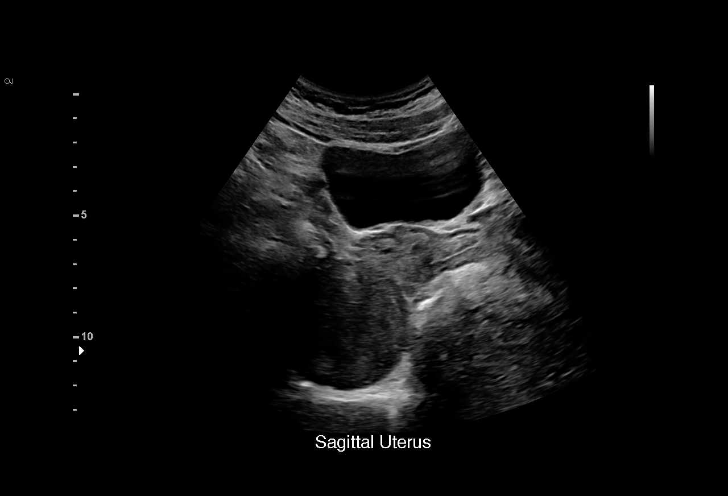
[im 8/52]
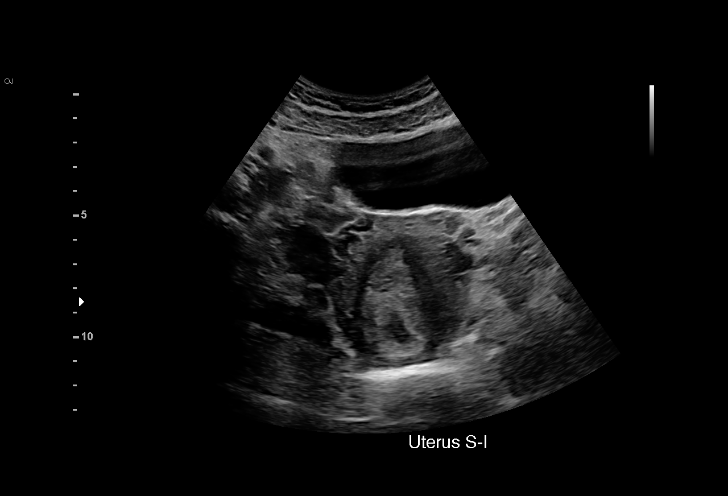
[im 12/52]
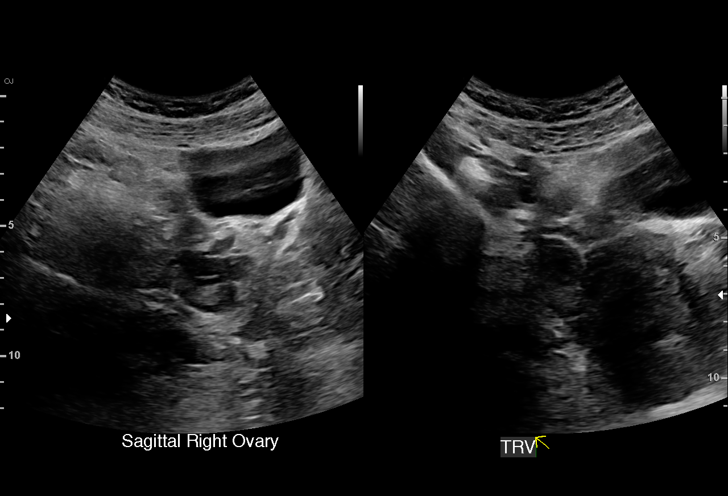
[im 16/52]
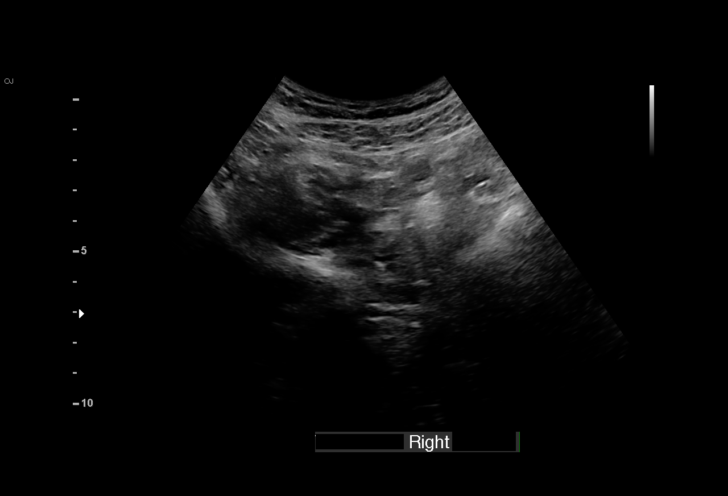
[im 19/52]
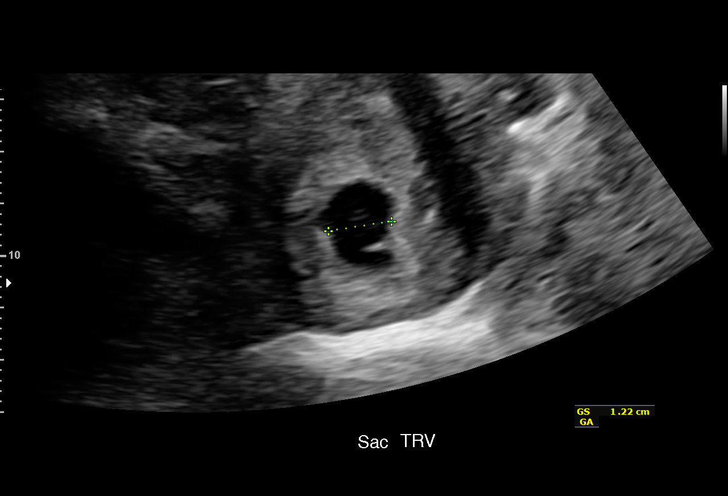
[im 23/52]
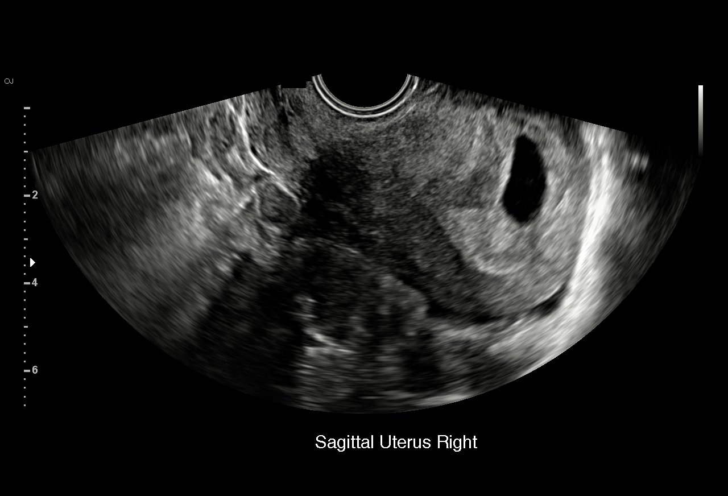
[im 27/52]
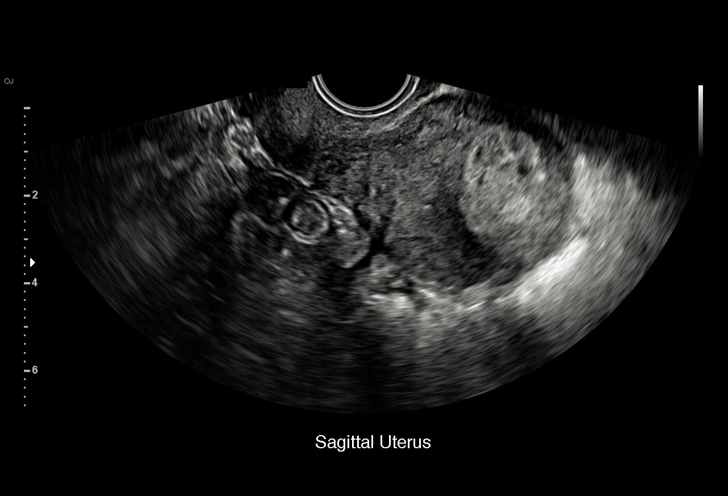
[im 29/52]
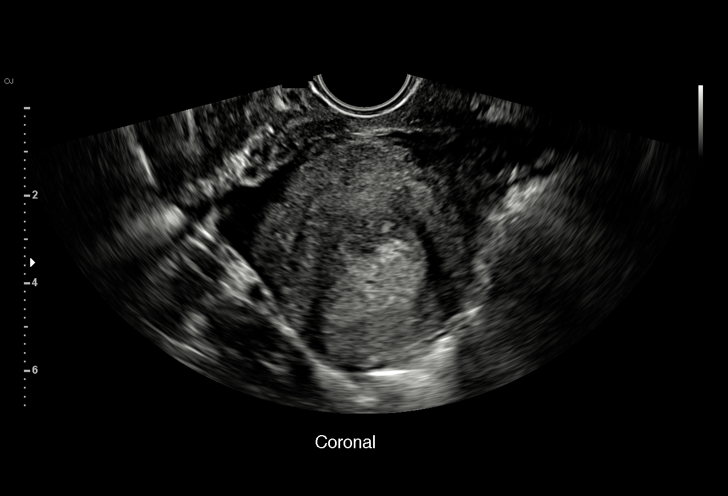
[im 33/52]
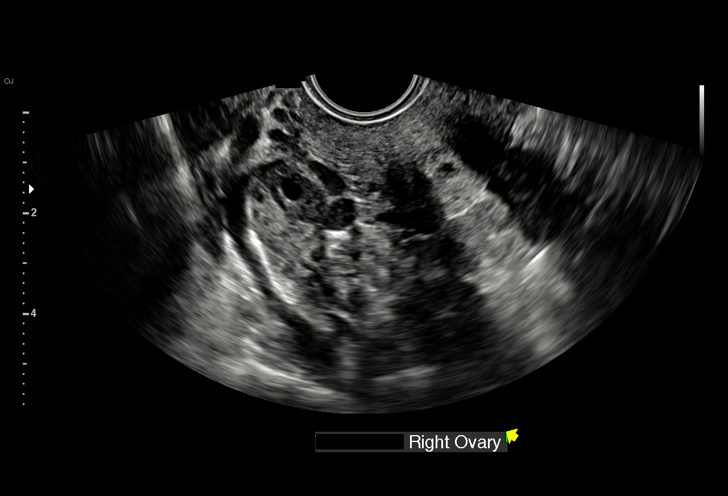
[im 36/52]
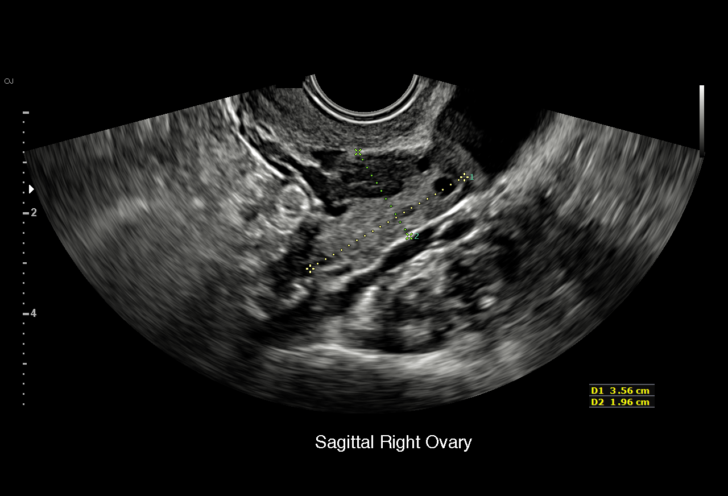
[im 40/52]
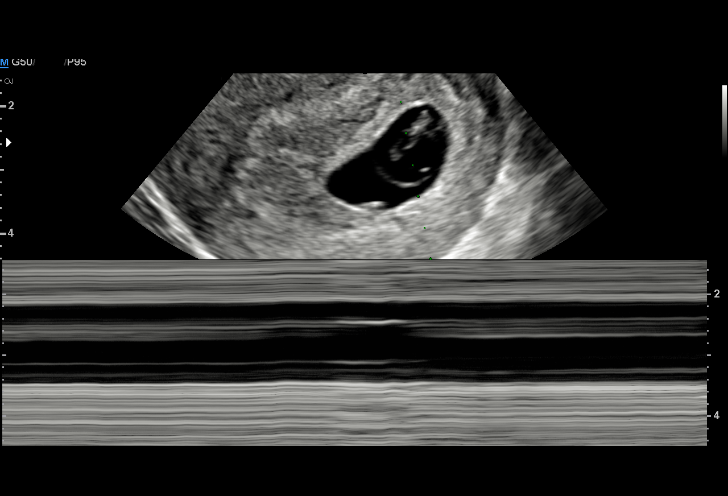
[im 44/52]
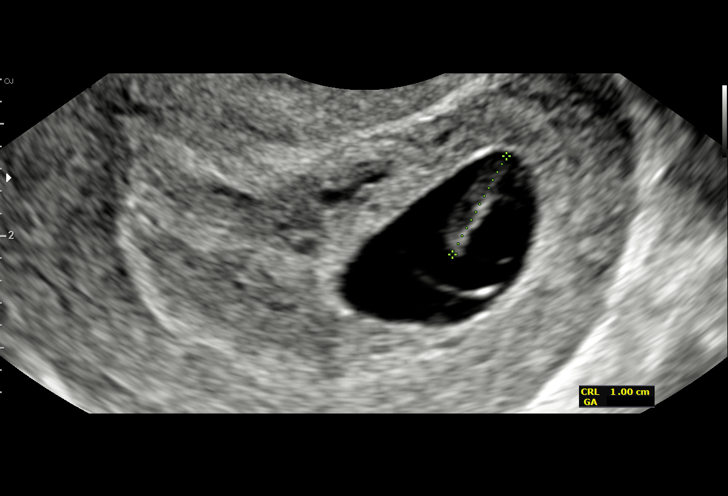
[im 48/52]
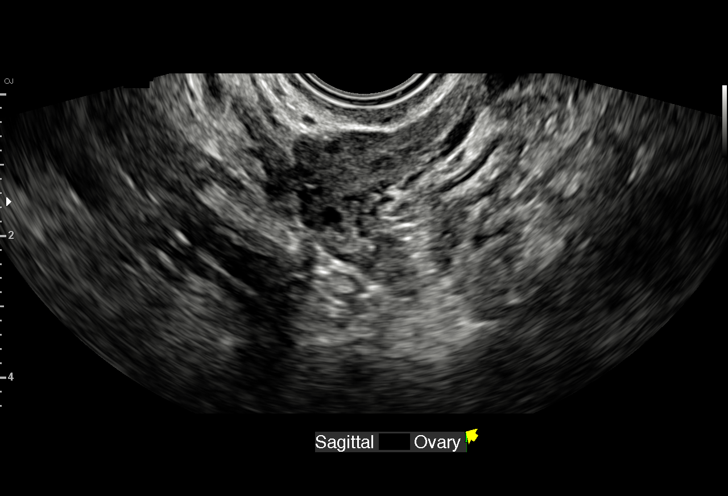
[im 52/52]
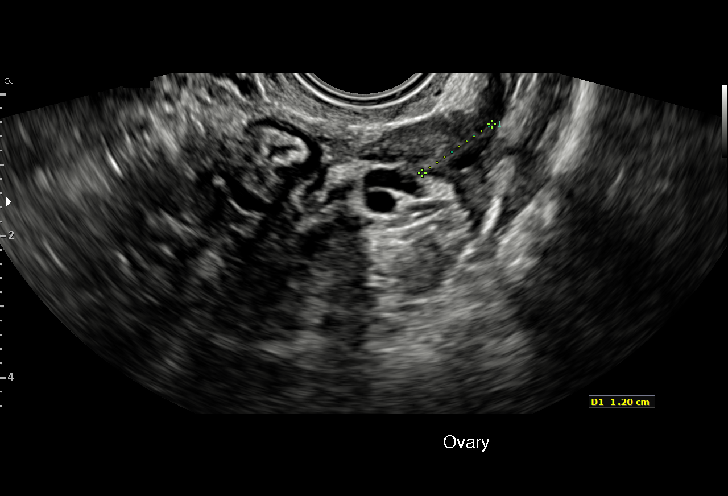

[15 of 28 positions shown; findings below may reference images not displayed]

FINDINGS: Intrauterine gestational sac: Single

Yolk sac:  Visualized.

Embryo:  Visualized.

Cardiac Activity: Not Visualized.

CRL:  9  mm   6 w   6 d                  US EDC: 06/17/2017

Subchorionic hemorrhage:  Tiny subchorionic hemorrhage noted.

Maternal uterus/adnexae: Both ovaries are normal in appearance. No
adnexal mass or free fluid identified.
IMPRESSION: Findings meet definitive criteria for failed pregnancy. This follows
SRU consensus guidelines: Diagnostic Criteria for Nonviable
Pregnancy Early in the First Trimester. N Engl J Med

## 2018-02-13 ENCOUNTER — Encounter (HOSPITAL_COMMUNITY): Payer: Self-pay

## 2018-02-13 ENCOUNTER — Ambulatory Visit (HOSPITAL_COMMUNITY): Payer: BC Managed Care – PPO

## 2018-02-13 ENCOUNTER — Ambulatory Visit (HOSPITAL_COMMUNITY)
Admission: RE | Admit: 2018-02-13 | Discharge: 2018-02-13 | Disposition: A | Discharge status: Home / Self Care | Payer: BC Managed Care – PPO | Source: Ambulatory Visit | Attending: Obstetrics and Gynecology | Admitting: Obstetrics and Gynecology

## 2018-02-13 DIAGNOSIS — O2622 Pregnancy care for patient with recurrent pregnancy loss, second trimester: Secondary | ICD-10-CM | POA: Insufficient documentation

## 2018-02-13 DIAGNOSIS — Z3A24 24 weeks gestation of pregnancy: Secondary | ICD-10-CM | POA: Diagnosis not present

## 2018-02-13 NOTE — Consult Note (Signed)
MFM Outpatient Consults   Wendy Osborne was seen for an outpatient high risk pregnancy consultation due to history of habitual abortion. Her husband was present for the duration of this consultation.  She is a 32 year old G5P1031 at 8924 weeks gestational age.  Her obstetric history is as follows: 2014- Term spontaneous vaginal delivery; uncomplicated. 2018- Spontaneous abortion at 5, 6 and [redacted] weeks gestational in January, April and August of 2018 respectively. 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  Past Medical History: None  Past Surgical History: None  Social History: Denies smoking tobacco, use of illicit drugs and or intake of alcohol. 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   Allergy History: Drug allergy history to penicillin(rash) is reported.  Family History; Father diagnosed with Leukemia at age 32 years and presently cured.  Review of Systems: 10 system review was negative except for endocrine system  Examination: Oriented in time place and person Vital signs: PR 84 bpm, RR 16 BP 105/62 mmHg BMI 24.8  FHT- 155-160 bpm  Fetal ultrasound was not performed at this consultation, as this was not requested. Wendy Osborne informs me that anatomy ultrasound was performed at her providers office and she was informed that normal findings were observed.  Recurrent miscarriage is a rather rare condition with an estimated incidence of 1% to 3%. Approximately 50% of all cases of early pregnancy loss are due to fetal chromosomal abnormalities. Although genetic factors are the major cause of spontaneous miscarriages, their relationship with recurrent miscarriage is less frequent.   Endocrine disorders such as poorly controlled diabetes, polycystic ovary syndrome, and hypothyroidism are linked with recurrent miscarriage. The  relationship between recurrent miscarriage and subclinical thyroid disorders and thyroid autoimmunity is disputed, especially in early miscarriages.   Uterine malformations should be considered as a cause of recurrent miscarriage, but since all losses are first trimester, this is highly unlikely.  Although autoimmune-based recurrent miscarriage has been described, mainly antiphospholipid antibodies, the role of alloimmune mechanisms remains poorly understood. The influence of congenital thrombophilia is controversial. Antiphospholipid syndrome or antiphospholipid antibody-related recurrent miscarriage, and some endocrinologic disorders, have a specific and effective treatment. Still, the effectiveness of some common treatments needs to be demonstrated.  Wendy Osborne is on VTE prophylaxis and synthroid. While these may not be the medical mitigation for recurrent miscarriage continuation is NOT discouraged at this time. Especially as there is no fetal risk and patient's desire to continue medication.  I have re-assured her that outcome of this pregnancy will most likely be optimal. However, I still recommend cautious optimism with recommended plan of care as follows: - Serial fetal weight at 3-4 week interval until delivery. This [facility]. - Commence weekly fetal surveillance from 32 weeks. - Induction of labor at 39 weeks for optimal delivery planning due to anticoagulant therapy. - Post delivery anticoagulation should be determined by patient and her provider. - Wendy Osborne would prefer ultrasound examination to be performed by her OB provider.  No further follow up with our team is needed unless otherwise indicated as pregnancy progresses.  Wendy Osborne and her husband verbalized understanding of our discussions and recommended plan of care as documented.  Blase MessHenry Hasana Alcorta MD MFM

## 2018-03-07 NOTE — L&D Delivery Note (Signed)
Delivery Note At 7:10 PM a viable female "Wendy Osborne" was delivered via Vaginal, Spontaneous (Presentation: direct OA ).  APGAR: 9, 9; weight pending .   Placenta status:spontaneous ,intact .  Cord: No nuchal cord  with the following complications:No nuchal cord .  Cord pH: n/a  Anesthesia:  Epidural Episiotomy: None Lacerations: Left labial/vaginal  Suture Repair: 2.0 chromic Est. Blood Loss (mL):  174  Placenta delivered intact.  Shortly after bleeding increased.  Atony noted but responded to fundal massage.  I/O cath of bladder yielded 50-75 ml of urine.  Cytotec 1000 mcg placed rectally.  Bleeding minimal while doing the repair.  Uterus was firm at end of procedure.  Mom to postpartum.  Baby to Couplet care / Skin to Skin.  Geryl Rankins 05/29/2018, 7:34 PM

## 2018-05-07 LAB — OB RESULTS CONSOLE GBS: GBS: NEGATIVE

## 2018-05-21 ENCOUNTER — Encounter (HOSPITAL_COMMUNITY): Payer: Self-pay | Admitting: *Deleted

## 2018-05-21 ENCOUNTER — Telehealth (HOSPITAL_COMMUNITY): Payer: Self-pay | Admitting: *Deleted

## 2018-05-21 NOTE — Telephone Encounter (Signed)
Preadmission screen  

## 2018-05-28 ENCOUNTER — Other Ambulatory Visit: Payer: Self-pay | Admitting: Obstetrics and Gynecology

## 2018-05-29 ENCOUNTER — Other Ambulatory Visit: Payer: Self-pay

## 2018-05-29 ENCOUNTER — Inpatient Hospital Stay (HOSPITAL_COMMUNITY)
Admission: AD | Admit: 2018-05-29 | Discharge: 2018-05-31 | DRG: 806 | Disposition: A | Discharge status: Home / Self Care | Payer: BC Managed Care – PPO | Attending: Obstetrics and Gynecology | Admitting: Obstetrics and Gynecology

## 2018-05-29 ENCOUNTER — Inpatient Hospital Stay (HOSPITAL_COMMUNITY): Payer: BC Managed Care – PPO | Admitting: Anesthesiology

## 2018-05-29 ENCOUNTER — Encounter (HOSPITAL_COMMUNITY): Payer: Self-pay | Admitting: *Deleted

## 2018-05-29 ENCOUNTER — Inpatient Hospital Stay (HOSPITAL_COMMUNITY): Payer: BC Managed Care – PPO

## 2018-05-29 DIAGNOSIS — D696 Thrombocytopenia, unspecified: Secondary | ICD-10-CM | POA: Diagnosis present

## 2018-05-29 DIAGNOSIS — O9912 Other diseases of the blood and blood-forming organs and certain disorders involving the immune mechanism complicating childbirth: Secondary | ICD-10-CM | POA: Diagnosis present

## 2018-05-29 DIAGNOSIS — D6859 Other primary thrombophilia: Secondary | ICD-10-CM | POA: Diagnosis present

## 2018-05-29 DIAGNOSIS — Z3A39 39 weeks gestation of pregnancy: Secondary | ICD-10-CM | POA: Diagnosis not present

## 2018-05-29 DIAGNOSIS — E02 Subclinical iodine-deficiency hypothyroidism: Secondary | ICD-10-CM | POA: Diagnosis present

## 2018-05-29 DIAGNOSIS — O99284 Endocrine, nutritional and metabolic diseases complicating childbirth: Secondary | ICD-10-CM | POA: Diagnosis present

## 2018-05-29 DIAGNOSIS — Z3490 Encounter for supervision of normal pregnancy, unspecified, unspecified trimester: Secondary | ICD-10-CM

## 2018-05-29 LAB — TYPE AND SCREEN
ABO/RH(D): O POS
Antibody Screen: NEGATIVE

## 2018-05-29 LAB — ABO/RH: ABO/RH(D): O POS

## 2018-05-29 LAB — CBC
HCT: 34.1 % — ABNORMAL LOW (ref 36.0–46.0)
HCT: 34.5 % — ABNORMAL LOW (ref 36.0–46.0)
Hemoglobin: 11.3 g/dL — ABNORMAL LOW (ref 12.0–15.0)
Hemoglobin: 11.7 g/dL — ABNORMAL LOW (ref 12.0–15.0)
MCH: 30.1 pg (ref 26.0–34.0)
MCH: 30.5 pg (ref 26.0–34.0)
MCHC: 33.1 g/dL (ref 30.0–36.0)
MCHC: 33.9 g/dL (ref 30.0–36.0)
MCV: 90.9 fL (ref 80.0–100.0)
MCV: 89.8 fL (ref 80.0–100.0)
PLATELETS: 152 10*3/uL (ref 150–400)
Platelets: 166 10*3/uL (ref 150–400)
RBC: 3.75 MIL/uL — ABNORMAL LOW (ref 3.87–5.11)
RBC: 3.84 MIL/uL — ABNORMAL LOW (ref 3.87–5.11)
RDW: 13.3 % (ref 11.5–15.5)
RDW: 13.3 % (ref 11.5–15.5)
WBC: 8.2 10*3/uL (ref 4.0–10.5)
WBC: 9.9 10*3/uL (ref 4.0–10.5)
nRBC: 0 % (ref 0.0–0.2)
nRBC: 0 % (ref 0.0–0.2)

## 2018-05-29 LAB — RPR: RPR Ser Ql: NONREACTIVE

## 2018-05-29 MED ORDER — METHYLERGONOVINE MALEATE 0.2 MG PO TABS: 0.2000 mg | ORAL_TABLET | ORAL | Status: DC | PRN

## 2018-05-29 MED ORDER — LIDOCAINE HCL (PF) 1 % IJ SOLN
INTRAMUSCULAR | Status: DC | PRN
  Administered 2018-05-29: 5 mL via EPIDURAL

## 2018-05-29 MED ORDER — TERBUTALINE SULFATE 1 MG/ML IJ SOLN: 0.2500 mg | Freq: Once | INTRAMUSCULAR | Status: DC | PRN

## 2018-05-29 MED ORDER — SODIUM CHLORIDE (PF) 0.9 % IJ SOLN
INTRAMUSCULAR | Status: DC | PRN
  Administered 2018-05-29: 12 mL/h via EPIDURAL

## 2018-05-29 MED ORDER — COCONUT OIL OIL: 1.0000 "application " | TOPICAL_OIL | Status: DC | PRN

## 2018-05-29 MED ORDER — OXYCODONE-ACETAMINOPHEN 5-325 MG PO TABS: 2.0000 | ORAL_TABLET | ORAL | Status: DC | PRN

## 2018-05-29 MED ORDER — SIMETHICONE 80 MG PO CHEW: 80.0000 mg | CHEWABLE_TABLET | ORAL | Status: DC | PRN

## 2018-05-29 MED ORDER — HYDROXYZINE HCL 50 MG PO TABS: 50.0000 mg | ORAL_TABLET | Freq: Four times a day (QID) | ORAL | Status: DC | PRN

## 2018-05-29 MED ORDER — DIBUCAINE 1 % RE OINT: 1.0000 "application " | TOPICAL_OINTMENT | RECTAL | Status: DC | PRN

## 2018-05-29 MED ORDER — ACETAMINOPHEN 325 MG PO TABS: 650.0000 mg | ORAL_TABLET | ORAL | Status: DC | PRN

## 2018-05-29 MED ORDER — EPHEDRINE 5 MG/ML INJ: 10.0000 mg | INTRAVENOUS | Status: DC | PRN

## 2018-05-29 MED ORDER — PHENYLEPHRINE 40 MCG/ML (10ML) SYRINGE FOR IV PUSH (FOR BLOOD PRESSURE SUPPORT): 80.0000 ug | PREFILLED_SYRINGE | INTRAVENOUS | Status: DC | PRN

## 2018-05-29 MED ORDER — OXYTOCIN 40 UNITS IN NORMAL SALINE INFUSION - SIMPLE MED
2.5000 [IU]/h | INTRAVENOUS | Status: DC
  Filled 2018-05-29: qty 1000

## 2018-05-29 MED ORDER — MISOPROSTOL 200 MCG PO TABS
ORAL_TABLET | ORAL | Status: AC
  Filled 2018-05-29: qty 5

## 2018-05-29 MED ORDER — BENZOCAINE-MENTHOL 20-0.5 % EX AERO: 1.0000 "application " | INHALATION_SPRAY | CUTANEOUS | Status: DC | PRN

## 2018-05-29 MED ORDER — LEVOTHYROXINE SODIUM 50 MCG PO TABS
50.0000 ug | ORAL_TABLET | Freq: Every day | ORAL | Status: DC
  Administered 2018-05-30 – 2018-05-31 (×2): 50 ug via ORAL
  Filled 2018-05-29 (×2): qty 1

## 2018-05-29 MED ORDER — PRENATAL MULTIVITAMIN CH
1.0000 | ORAL_TABLET | Freq: Every day | ORAL | Status: DC
  Administered 2018-05-30 – 2018-05-31 (×2): 1 via ORAL
  Filled 2018-05-29 (×2): qty 1

## 2018-05-29 MED ORDER — OXYCODONE HCL 5 MG PO TABS: 10.0000 mg | ORAL_TABLET | ORAL | Status: DC | PRN

## 2018-05-29 MED ORDER — ENOXAPARIN SODIUM 40 MG/0.4ML ~~LOC~~ SOLN
40.0000 mg | SUBCUTANEOUS | Status: DC
  Administered 2018-05-30: 40 mg via SUBCUTANEOUS
  Filled 2018-05-29: qty 0.4

## 2018-05-29 MED ORDER — ENOXAPARIN SODIUM 40 MG/0.4ML ~~LOC~~ SOLN: 40.0000 mg | SUBCUTANEOUS | Status: DC

## 2018-05-29 MED ORDER — TETANUS-DIPHTH-ACELL PERTUSSIS 5-2.5-18.5 LF-MCG/0.5 IM SUSP: 0.5000 mL | Freq: Once | INTRAMUSCULAR | Status: DC

## 2018-05-29 MED ORDER — DIPHENHYDRAMINE HCL 50 MG/ML IJ SOLN: 12.5000 mg | INTRAMUSCULAR | Status: DC | PRN

## 2018-05-29 MED ORDER — LACTATED RINGERS IV SOLN
500.0000 mL | INTRAVENOUS | Status: DC | PRN
  Administered 2018-05-29: 1000 mL via INTRAVENOUS

## 2018-05-29 MED ORDER — LIDOCAINE HCL (PF) 1 % IJ SOLN: 30.0000 mL | INTRAMUSCULAR | Status: DC | PRN

## 2018-05-29 MED ORDER — OXYTOCIN 40 UNITS IN NORMAL SALINE INFUSION - SIMPLE MED
1.0000 m[IU]/min | INTRAVENOUS | Status: DC
  Administered 2018-05-29: 2 m[IU]/min via INTRAVENOUS

## 2018-05-29 MED ORDER — LACTATED RINGERS IV SOLN
500.0000 mL | Freq: Once | INTRAVENOUS | Status: AC
  Administered 2018-05-29: 500 mL via INTRAVENOUS

## 2018-05-29 MED ORDER — ONDANSETRON HCL 4 MG/2ML IJ SOLN: 4.0000 mg | Freq: Four times a day (QID) | INTRAMUSCULAR | Status: DC | PRN

## 2018-05-29 MED ORDER — MISOPROSTOL 200 MCG PO TABS
1000.0000 ug | ORAL_TABLET | Freq: Once | ORAL | Status: AC
  Administered 2018-05-29: 1000 ug via RECTAL

## 2018-05-29 MED ORDER — LACTATED RINGERS IV SOLN
INTRAVENOUS | Status: DC
  Administered 2018-05-29 (×4): via INTRAVENOUS

## 2018-05-29 MED ORDER — OXYCODONE-ACETAMINOPHEN 5-325 MG PO TABS: 1.0000 | ORAL_TABLET | ORAL | Status: DC | PRN

## 2018-05-29 MED ORDER — ONDANSETRON HCL 4 MG/2ML IJ SOLN: 4.0000 mg | INTRAMUSCULAR | Status: DC | PRN

## 2018-05-29 MED ORDER — LACTATED RINGERS IV SOLN: 500.0000 mL | Freq: Once | INTRAVENOUS | Status: DC

## 2018-05-29 MED ORDER — FENTANYL-BUPIVACAINE-NACL 0.5-0.125-0.9 MG/250ML-% EP SOLN
12.0000 mL/h | EPIDURAL | Status: DC | PRN
  Filled 2018-05-29: qty 250

## 2018-05-29 MED ORDER — OXYCODONE HCL 5 MG PO TABS: 5.0000 mg | ORAL_TABLET | ORAL | Status: DC | PRN

## 2018-05-29 MED ORDER — IBUPROFEN 600 MG PO TABS
600.0000 mg | ORAL_TABLET | Freq: Four times a day (QID) | ORAL | Status: DC
  Administered 2018-05-29 – 2018-05-31 (×7): 600 mg via ORAL
  Filled 2018-05-29 (×7): qty 1

## 2018-05-29 MED ORDER — SOD CITRATE-CITRIC ACID 500-334 MG/5ML PO SOLN: 30.0000 mL | ORAL | Status: DC | PRN

## 2018-05-29 MED ORDER — OXYTOCIN BOLUS FROM INFUSION
500.0000 mL | Freq: Once | INTRAVENOUS | Status: AC
  Administered 2018-05-29: 500 mL via INTRAVENOUS

## 2018-05-29 MED ORDER — SENNOSIDES-DOCUSATE SODIUM 8.6-50 MG PO TABS
2.0000 | ORAL_TABLET | ORAL | Status: DC
  Administered 2018-05-29 – 2018-05-31 (×2): 2 via ORAL
  Filled 2018-05-29 (×2): qty 2

## 2018-05-29 MED ORDER — FENTANYL CITRATE (PF) 100 MCG/2ML IJ SOLN: 50.0000 ug | INTRAMUSCULAR | Status: DC | PRN

## 2018-05-29 MED ORDER — METHYLERGONOVINE MALEATE 0.2 MG/ML IJ SOLN: 0.2000 mg | INTRAMUSCULAR | Status: DC | PRN

## 2018-05-29 MED ORDER — DIPHENHYDRAMINE HCL 25 MG PO CAPS: 25.0000 mg | ORAL_CAPSULE | Freq: Four times a day (QID) | ORAL | Status: DC | PRN

## 2018-05-29 MED ORDER — ZOLPIDEM TARTRATE 5 MG PO TABS: 5.0000 mg | ORAL_TABLET | Freq: Every evening | ORAL | Status: DC | PRN

## 2018-05-29 MED ORDER — MISOPROSTOL 25 MCG QUARTER TABLET
25.0000 ug | ORAL_TABLET | ORAL | Status: DC | PRN
  Administered 2018-05-29 (×2): 25 ug via VAGINAL
  Filled 2018-05-29 (×2): qty 1

## 2018-05-29 MED ORDER — ONDANSETRON HCL 4 MG PO TABS: 4.0000 mg | ORAL_TABLET | ORAL | Status: DC | PRN

## 2018-05-29 MED ORDER — WITCH HAZEL-GLYCERIN EX PADS: 1.0000 "application " | MEDICATED_PAD | CUTANEOUS | Status: DC | PRN

## 2018-05-29 MED ORDER — FENTANYL-BUPIVACAINE-NACL 0.5-0.125-0.9 MG/250ML-% EP SOLN: 12.0000 mL/h | EPIDURAL | Status: DC | PRN

## 2018-05-29 MED ORDER — FERROUS SULFATE 325 (65 FE) MG PO TABS
325.0000 mg | ORAL_TABLET | Freq: Two times a day (BID) | ORAL | Status: DC
  Administered 2018-05-30 – 2018-05-31 (×3): 325 mg via ORAL
  Filled 2018-05-29 (×3): qty 1

## 2018-05-29 MED ORDER — MAGNESIUM HYDROXIDE 400 MG/5ML PO SUSP: 30.0000 mL | ORAL | Status: DC | PRN

## 2018-05-29 NOTE — Progress Notes (Signed)
  Subjective: Patient is comfortable with her epidural. Reports bloody show no leakage of fluid   Objective: BP 110/70   Pulse 81   Temp 98.5 F (36.9 C) (Oral)   Resp 16   Ht 4\' 11"  (1.499 m)   Wt 73.9 kg   LMP 08/29/2017   BMI 32.92 kg/m  I/O last 3 completed shifts: In: 283.5 [I.V.:283.5] Out: -  No intake/output data recorded.  FHT:  FHR: 140 bpm, variability: moderate,  accelerations:  Present,  decelerations:  Absent UC:   regular, every 1-2 minutes SVE:4/75/-1 AROM clear fluid  Labs: Lab Results  Component Value Date   WBC 9.9 05/29/2018   HGB 11.7 (L) 05/29/2018   HCT 34.5 (L) 05/29/2018   MCV 89.8 05/29/2018   PLT 152 05/29/2018    Assessment / Plan: Induction of labor due to thrombophilia ,  progressing well on pitocin  Labor: Progressing on pitocin  Preeclampsia:  NA Fetal Wellbeing:  Category I Pain Control:  Epidural I/D:  n/a Anticipated MOD:  NSVD  Dr. Dion Body covering until 7 pm.. [facility]  To assume care at 7pm... I will assume care at 7am 05/30/2018  Gerald Leitz 05/29/2018, 5:20 PM
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
## 2018-05-29 NOTE — Anesthesia Procedure Notes (Addendum)
Epidural Patient location during procedure: OB Start time: 05/29/2018 4:32 PM End time: 05/29/2018 4:47 PM  Staffing Anesthesiologist: Trevor Iha, MD Performed: anesthesiologist   Preanesthetic Checklist Completed: patient identified, site marked, surgical consent, pre-op evaluation, timeout performed, IV checked, risks and benefits discussed and monitors and equipment checked  Epidural Patient position: sitting Prep: site prepped and draped and DuraPrep Patient monitoring: continuous pulse ox and blood pressure Approach: midline Location: L3-L4 Injection technique: LOR air  Needle:  Needle type: Tuohy  Needle gauge: 17 G Needle length: 9 cm and 9 Needle insertion depth: 6 cm Catheter type: closed end flexible Catheter size: 19 Gauge Catheter at skin depth: 11 cm Test dose: negative  Assessment Events: blood not aspirated, injection not painful, no injection resistance, negative IV test and no paresthesia  Additional Notes Patient identified. Risks/Benefits/Options discussed with patient including but not limited to bleeding, infection, nerve damage, paralysis, failed block, incomplete pain control, headache, blood pressure changes, nausea, vomiting, reactions to medication both or allergic, itching and postpartum back pain. Confirmed with bedside nurse the patient's most recent platelet count. Confirmed with patient that they are not currently taking any anticoagulation, have any bleeding history or any family history of bleeding disorders. Patient expressed understanding and wished to proceed. All questions were answered. Sterile technique was used throughout the entire procedure. Please see nursing notes for vital signs. Test dose was given through epidural needle and negative prior to continuing to dose epidural or start infusion. Warning signs of high block given to the patient including shortness of breath, tingling/numbness in hands, complete motor block, or any  concerning symptoms with instructions to call for help. Patient was given instructions on fall risk and not to get out of bed. All questions and concerns addressed with instructions to call with any issues.  Attempt (S) . Patient tolerated procedure well.

## 2018-05-29 NOTE — Anesthesia Preprocedure Evaluation (Addendum)
Anesthesia Evaluation  Patient identified by MRN, date of birth, ID band Patient awake    Reviewed: Allergy & Precautions, NPO status , Patient's Chart, lab work & pertinent test results  Airway Mallampati: II  TM Distance: >3 FB Neck ROM: Full    Dental no notable dental hx. (+) Teeth Intact   Pulmonary asthma ,    Pulmonary exam normal breath sounds clear to auscultation       Cardiovascular Exercise Tolerance: Good negative cardio ROS Normal cardiovascular exam Rhythm:Regular Rate:Normal     Neuro/Psych  Headaches,    GI/Hepatic negative GI ROS, Neg liver ROS,   Endo/Other  Hypothyroidism   Renal/GU      Musculoskeletal   Abdominal   Peds  Hematology Hx of Thrombocytopenia Hgb 11.7 Plts 152   Anesthesia Other Findings   Reproductive/Obstetrics                            Anesthesia Physical Anesthesia Plan  ASA: III  Anesthesia Plan:    Post-op Pain Management:    Induction:   PONV Risk Score and Plan:   Airway Management Planned:   Additional Equipment:   Intra-op Plan:   Post-operative Plan:   Informed Consent:   Plan Discussed with:   Anesthesia Plan Comments:         Anesthesia Quick Evaluation

## 2018-05-29 NOTE — Progress Notes (Signed)
   Subjective: Patient s/p cytotec x 2 last dose at 510 am.. occasional contraction rated as 2 /10 . .. no lof no vaginal bleeding   Objective: BP 114/60   Pulse 70   Temp 99.3 F (37.4 C) (Oral)   Resp 17   Ht 4\' 11"  (1.499 m)   Wt 73.9 kg   LMP 08/29/2017   BMI 32.92 kg/m  I/O last 3 completed shifts: In: 283.5 [I.V.:283.5] Out: -  No intake/output data recorded.  FHT:  FHR: 140 bpm, variability: moderate,  accelerations:  Present,  decelerations:  Absent UC:   irregular SVE:   2/50/-2 Labs: Lab Results  Component Value Date   WBC 8.2 05/29/2018   HGB 11.3 (L) 05/29/2018   HCT 34.1 (L) 05/29/2018   MCV 90.9 05/29/2018   PLT 166 05/29/2018    Assessment / Plan: induction of labor /thrombophilia   Labor: start pitocin 2x2 Preeclampsia:  NA Fetal Wellbeing:  Category I Pain Control:  Labor support without medications I/D:  n/a Anticipated MOD:  NSVD  Gerald Leitz 05/29/2018, 9:17 AM

## 2018-05-29 NOTE — Progress Notes (Signed)
Pt ID band barcode not working, unable to scan medications

## 2018-05-30 LAB — CBC
HCT: 29.9 % — ABNORMAL LOW (ref 36.0–46.0)
HEMOGLOBIN: 10.3 g/dL — AB (ref 12.0–15.0)
MCH: 30.9 pg (ref 26.0–34.0)
MCHC: 34.4 g/dL (ref 30.0–36.0)
MCV: 89.8 fL (ref 80.0–100.0)
Platelets: 159 10*3/uL (ref 150–400)
RBC: 3.33 MIL/uL — ABNORMAL LOW (ref 3.87–5.11)
RDW: 13.2 % (ref 11.5–15.5)
WBC: 10.2 10*3/uL (ref 4.0–10.5)
nRBC: 0 % (ref 0.0–0.2)

## 2018-05-30 MED ORDER — IBUPROFEN 600 MG PO TABS: 600.0000 mg | ORAL_TABLET | Freq: Four times a day (QID) | ORAL | 0 refills | Status: AC | PRN

## 2018-05-30 MED ORDER — ENOXAPARIN SODIUM 40 MG/0.4ML ~~LOC~~ SOLN: 40.0000 mg | SUBCUTANEOUS | 0 refills | Status: AC

## 2018-05-30 NOTE — Progress Notes (Signed)
Post Partum Day 1 SVD Subjective: no complaints, up ad lib, voiding and tolerating PO  Objective: Blood pressure 99/64, pulse 77, temperature 98 F (36.7 C), temperature source Oral, resp. rate 18, height 4\' 11"  (1.499 m), weight 73.9 kg, last menstrual period 08/29/2017, SpO2 99 %, unknown if currently breastfeeding.  Physical Exam:  General: alert, cooperative and no distress Lochia: appropriate Uterine Fundus: firm Incision: NA DVT Evaluation: No evidence of DVT seen on physical exam.  Recent Labs    05/29/18 1613 05/30/18 0603  HGB 11.7* 10.3*  HCT 34.5* 29.9*    Assessment/Plan: Plan for discharge tomorrow, Breastfeeding, Lactation consult and Circumcision prior to discharge  Thrombophilia - lovenox 40 mg [place] starting this evening.  Dr. Charlotta Newton covering tomorrow 05/31/2018   LOS: 1 day   Wendy Osborne 05/30/2018, 2:24 PM

## 2018-05-30 NOTE — Anesthesia Postprocedure Evaluation (Signed)
Anesthesia Post Note  Patient: Elisheva Desposito  Procedure(s) Performed: AN AD HOC LABOR EPIDURAL     Patient location during evaluation: Mother Baby Anesthesia Type: Epidural Level of consciousness: awake and alert Pain management: pain level controlled Vital Signs Assessment: post-procedure vital signs reviewed and stable Respiratory status: spontaneous breathing, nonlabored ventilation and respiratory function stable Cardiovascular status: stable Postop Assessment: no headache, no backache and epidural receding Anesthetic complications: no Comments: Spoke with pt via phone call. No concerns noted    Last Vitals:  Vitals:   05/30/18 0631 05/30/18 1029  BP: 94/61 99/64  Pulse: 61 77  Resp: 18 18  Temp: 36.9 C 36.7 C  SpO2: 99% 99%    Last Pain:  Vitals:   05/30/18 1029  TempSrc: Oral  PainSc: 0-No pain   Pain Goal:                   Dallas Scorsone

## 2018-05-30 NOTE — Lactation Note (Addendum)
This note was copied from a baby's chart. Lactation Consultation Note  Patient Name: Wendy Osborne FYBOF'B Date: 05/30/2018 Reason for consult: Follow-up assessment;Mother's request;Term P2, 27 hour female infant. 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 Per mom, her milk supply was high so infant did not loose a lot of weight and she used NS with her eldest son.  Per mom, infant had voids x 2 and stools one since delivery. Mom has a nipple stripe on right breast she feels infant doesn't  have a deep latch. Mom mostly been using the football hold position . Mom has breast shells but she has not been wearing them as advised by LC early. Mom plans to start wearing them tomorrow morning in her bra  LC discussed with mom to do breast stimulation and a little hand expression prior to latching infant to breast.  Mom was open to trying the cross cradle hold, mom latched infant on right breast, infant latched without difficulty and swallows observed. Per mom, she felt latch was deeper and no pain with latch. Mom will start using this position since she is feeling comfortable with it.  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 LC discussed with mom when infant appears not to latch do hand expression and given infant back EBM to avoid miss feeds   LC reviewed  hand expression and mom expressed  2 ml of colostrum that was spoon feed to infant. Mom knows to breastfeed according to hunger cues, infant is currently cluster feeding.  Mom knows to call Nurse or LC if she has any further questions, concerns or need assistance with latching infant to breast.   Maternal Data    Feeding Feeding Type: Breast Fed  LATCH Score Latch: Grasps breast easily, tongue down, lips flanged, rhythmical sucking.  Audible  Swallowing: A few with stimulation  Type of Nipple: Flat  Comfort (Breast/Nipple): Soft / non-tender  Hold (Positioning): Assistance needed to correctly position infant at breast and maintain latch.  LATCH Score: 7  Interventions Interventions: Assisted with latch;Hand express;Adjust position;Support pillows;Position options  Lactation Tools Discussed/Used     Consult Status Consult Status: Follow-up Date: 05/31/18 Follow-up type: In-patient    Danelle Earthly 05/30/2018, 10:52 PM
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
## 2018-05-30 NOTE — Discharge Instructions (Signed)
Vaginal Delivery, Care After °Refer to this sheet in the next few weeks. These instructions provide you with information about caring for yourself after vaginal delivery. Your health care provider may also give you more specific instructions. Your treatment has been planned according to current medical practices, but problems sometimes occur. Call your health care provider if you have any problems or questions. °What can I expect after the procedure? °After vaginal delivery, it is common to have: °· Some bleeding from your vagina. °· Soreness in your abdomen, your vagina, and the area of skin between your vaginal opening and your anus (perineum). °· Pelvic cramps. °· Fatigue. °Follow these instructions at home: °Medicines °· Take over-the-counter and prescription medicines only as told by your health care provider. °· If you were prescribed an antibiotic medicine, take it as told by your health care provider. Do not stop taking the antibiotic until it is finished. °Driving ° °· Do not drive or operate heavy machinery while taking prescription pain medicine. °· Do not drive for 24 hours if you received a sedative. °Lifestyle °· Do not drink alcohol. This is especially important if you are breastfeeding or taking medicine to relieve pain. °· Do not use tobacco products, including cigarettes, chewing tobacco, or e-cigarettes. If you need help quitting, ask your health care provider. °Eating and drinking °· Drink at least 8 eight-ounce glasses of water every day unless you are told not to by your health care provider. If you choose to breastfeed your baby, you may need to drink more water than this. °· Eat high-fiber foods every day. These foods may help prevent or relieve constipation. High-fiber foods include: °? Whole grain cereals and breads. °? Brown rice. °? Beans. °? Fresh fruits and vegetables. °Activity °· Return to your normal activities as told by your health care provider. Ask your health care provider what  activities are safe for you. °· Rest as much as possible. Try to rest or take a nap when your baby is sleeping. °· Do not lift anything that is heavier than your baby or 10 lb (4.5 kg) until your health care provider says that it is safe. °· Talk with your health care provider about when you can engage in sexual activity. This may depend on your: °? Risk of infection. °? Rate of healing. °? Comfort and desire to engage in sexual activity. °Vaginal Care °· If you have an episiotomy or a vaginal tear, check the area every day for signs of infection. Check for: °? More redness, swelling, or pain. °? More fluid or blood. °? Warmth. °? Pus or a bad smell. °· Do not use tampons or douches until your health care provider says this is safe. °· Watch for any blood clots that may pass from your vagina. These may look like clumps of dark red, brown, or black discharge. °General instructions °· Keep your perineum clean and dry as told by your health care provider. °· Wear loose, comfortable clothing. °· Wipe from front to back when you use the toilet. °· Ask your health care provider if you can shower or take a bath. If you had an episiotomy or a perineal tear during labor and delivery, your health care provider may tell you not to take baths for a certain length of time. °· Wear a bra that supports your breasts and fits you well. °· If possible, have someone help you with household activities and help care for your baby for at least a few days after you   leave the hospital. °· Keep all follow-up visits for you and your baby as told by your health care provider. This is important. °Contact a health care provider if: °· You have: °? Vaginal discharge that has a bad smell. °? Difficulty urinating. °? Pain when urinating. °? A sudden increase or decrease in the frequency of your bowel movements. °? More redness, swelling, or pain around your episiotomy or vaginal tear. °? More fluid or blood coming from your episiotomy or vaginal  tear. °? Pus or a bad smell coming from your episiotomy or vaginal tear. °? A fever. °? A rash. °? Little or no interest in activities you used to enjoy. °? Questions about caring for yourself or your baby. °· Your episiotomy or vaginal tear feels warm to the touch. °· Your episiotomy or vaginal tear is separating or does not appear to be healing. °· Your breasts are painful, hard, or turn red. °· You feel unusually sad or worried. °· You feel nauseous or you vomit. °· You pass large blood clots from your vagina. If you pass a blood clot from your vagina, save it to show to your health care provider. Do not flush blood clots down the toilet without having your health care provider look at them. °· You urinate more than usual. °· You are dizzy or light-headed. °· You have not breastfed at all and you have not had a menstrual period for 12 weeks after delivery. °· You have stopped breastfeeding and you have not had a menstrual period for 12 weeks after you stopped breastfeeding. °Get help right away if: °· You have: °? Pain that does not go away or does not get better with medicine. °? Chest pain. °? Difficulty breathing. °? Blurred vision or spots in your vision. °? Thoughts about hurting yourself or your baby. °· You develop pain in your abdomen or in one of your legs. °· You develop a severe headache. °· You faint. °· You bleed from your vagina so much that you fill two sanitary pads in one hour. °This information is not intended to replace advice given to you by your health care provider. Make sure you discuss any questions you have with your health care provider. °Document Released: 02/19/2000 Document Revised: 08/05/2015 Document Reviewed: 03/08/2015 °Elsevier Interactive Patient Education © 2019 [employer] ° °

## 2018-05-31 NOTE — Discharge Summary (Signed)
OB Discharge Summary     Patient Name: Wendy Osborne DOB: 1985/08/17 MRN: [id number]  Date of admission: 05/29/2018 Delivering MD: Geryl Rankins   Date of discharge: 05/31/2018  Admitting diagnosis: No admission diagnoses are documented for this encounter. Intrauterine pregnancy: [redacted]w[redacted]d     Secondary diagnosis:  Active Problems:   Term pregnancy  Additional problems: thrombocytopenia, subclinical hypothyroidism     Discharge diagnosis: Term Pregnancy Delivered                                                                                                Post partum procedures:none  Augmentation: Pitocin and Cytotec  Complications: None  Hospital course:  Induction of Labor With Vaginal Delivery   33 y.o. yo [id number] at [redacted]w[redacted]d was admitted to the hospital 05/29/2018 for induction of labor.  Indication for induction: thrombocytopenia.  Patient had an uncomplicated labor course as follows: Membrane Rupture Time/Date: 5:15 PM ,05/29/2018   Intrapartum Procedures: Episiotomy: None [1]                                         Lacerations:  Labial [10]  Patient had delivery of a Viable infant.  Information for the patient's newborn:  Arbella, Krolik [[id number]]  Delivery Method: Vag-Spont   05/29/2018  Details of delivery can be found in separate delivery note.  Patient had a routine postpartum course. Patient is discharged home 05/31/18.  Physical exam  Vitals:   05/30/18 1029 05/30/18 1450 05/30/18 2214 05/31/18 0627  BP: 99/64 107/70 (!) 96/56 110/65  Pulse: 77 79 77 71  Resp: 18 18 18 18   Temp: 98 F (36.7 C) 98.3 F (36.8 C) 98.1 F (36.7 C) 97.8 F (36.6 C)  TempSrc: Oral Oral Oral Oral  SpO2: 99%     Weight:      Height:       General: alert, cooperative and no distress Lochia: appropriate Uterine Fundus: firm Incision: N/A DVT Evaluation: No evidence of DVT seen on physical exam. Labs: Lab Results  Component Value Date   WBC 10.2 05/30/2018   HGB  10.3 (L) 05/30/2018   HCT 29.9 (L) 05/30/2018   MCV 89.8 05/30/2018   PLT 159 05/30/2018   No flowsheet data found.  Discharge instruction: per After Visit Summary and "Baby and Me Booklet".  After visit meds:  Allergies as of 05/31/2018      Reactions   Penicillins Rash   Did it involve swelling of the face/tongue/throat, SOB, or low BP? No Did it involve sudden or severe rash/hives, skin peeling, or any reaction on the inside of your mouth or nose? No Did you need to seek medical attention at a hospital or doctor's office? No When did it last happen?childhood If all above answers are "NO", may proceed with cephalosporin use.      Medication List    STOP taking these medications   heparin 01601 UNIT/ML injection     TAKE these medications   enoxaparin 40 MG/0.4ML injection Commonly  known as:  LOVENOX Inject 0.4 mLs (40 mg total) into the skin daily.   ibuprofen 600 MG tablet Commonly known as:  ADVIL,MOTRIN Take 1 tablet (600 mg total) by mouth every 6 (six) hours as needed.   levothyroxine 50 MCG tablet Commonly known as:  SYNTHROID, LEVOTHROID Take 50 mcg by mouth daily before breakfast.   prenatal multivitamin Tabs tablet Take 1 tablet by mouth daily at 12 noon. Reported on 08/07/2015       Diet: routine diet  Activity: Advance as tolerated. Pelvic rest for 6 weeks.   Outpatient follow up:6 weeks Follow up Appt:No future appointments. Follow up Visit:No follow-ups on file.  Postpartum contraception: Not Discussed  Newborn Data: Live born female  Birth Weight: 7 lb 1.2 oz (3209 g) APGAR: 9, 9  Newborn Delivery   Birth date/time:  05/29/2018 19:10:00 Delivery type:  Vaginal, Spontaneous     Baby Feeding: Breast Disposition:home with mother   05/31/2018 Sharon Seller, DO

## 2018-06-26 ENCOUNTER — Telehealth: Payer: BC Managed Care – PPO | Admitting: Physician Assistant

## 2018-06-26 DIAGNOSIS — J069 Acute upper respiratory infection, unspecified: Secondary | ICD-10-CM

## 2018-06-26 NOTE — Progress Notes (Signed)
E-Visit for Corona Virus Screening  Based on your current symptoms, you may very well have the virus, however your symptoms are mild. Currently, not all patients are being tested. If the symptoms are mild and there is not a known exposure, performing the test is not indicated.  Coronavirus disease 2019 (COVID-19)is a respiratory illness that can spread from person to person. The virus that causes COVID-19 is a new virus that was first identified in the country of [place] but is now found in multiple other countries and has spread to the [place].  Symptoms associated with the virus are mild to severe fever, cough, and shortness of breath. There is currently no vaccine to protect against COVID-19, and there is no specific antiviral treatment for the virus.   To be considered HIGH RISK for Coronavirus (COVID-19), you have to meet the following criteria:  . Traveled to [place], [place], [place], [place] or [place]; or in the [place] to [place], [place], [place], or [place]; and have fever, cough, and shortness of breath within the last 2 weeks of travel OR  . Been in close contact with a person diagnosed with COVID-19 within the last 2 weeks and have fever, cough, and shortness of breath  . IF YOU DO NOT MEET THESE CRITERIA, YOU ARE CONSIDERED LOW RISK FOR COVID-19.   It is vitally important that if you feel that you have an infection such as this virus or any other virus that you stay home and away from places where you may spread it to others.  You should self-quarantine for 14 days if you have symptoms that could potentially be coronavirus and avoid contact with people age 57 and older.     You may also take acetaminophen (Tylenol) 1000 mg every 8 hour as needed for fever, sore throat, and generalized pain. This is safe to use while breast feeding.    Reduce your risk of any infection by using the same precautions used for avoiding the common cold or flu: Wash your hands often  with soap and warm water for at least 20 seconds.  If soap and water are not readily available, use an alcohol-based hand sanitizer with at least 60% alcohol.  If coughing or sneezing, cover your mouth and nose by coughing or sneezing into the elbow areas of your shirt or coat, into a tissue or into your sleeve (not your hands). Avoid shaking hands with others and consider head nods or verbal greetings only.  Avoid touching your eyes,nose, or mouth with unwashed hands. Avoid close contact with people who are sick. Avoid places or events with large numbers of people in one location, like concerts or sporting events. Carefully consider travel plans you have or are making. If you are planning any travel outside or inside the [place], visit the CDC'sTravelers' Health webpagefor the latest health notices. If you have some symptoms but not all symptoms, continue to monitor at home and seek medical attention if your symptoms worsen. If you are having a medical emergency, call 911.  HOME CARE Only take medications as instructed by your medical team. Drink plenty of fluids and get plenty of rest. A steam or ultrasonic humidifier can help if you have congestion.   GET HELP RIGHT AWAY IF: You develop worsening fever. You become short of breath You cough up blood. Your symptoms become more severe MAKE SURE YOU  Understand these instructions. Will watch your condition. Will get help right away if you are not doing  well or get worse.  Your e-visit answers were reviewed by a board certified advanced clinical practitioner to complete your personal care plan.  Depending on the condition, your plan could have included both over the counter or prescription medications.  If there is a problem please reply once you have received a response from your provider. Your safety is important to us.  If you have drug allergies check your prescription carefully.    You can use MyChart to ask questions about today's  visit, request a non-urgent call back, or ask for a work or school excuse for 24 hours related to this e-Visit. If it has been greater than 24 hours you will need to follow up with your provider, or enter a new e-Visit to address those concerns. You will get an e-mail in the next two days asking about your experience.  I hope that your e-visit has been valuable and will speed your recovery. Thank you for using e-visits.    ===View-only below this line===   ----- Message -----    From: Wendy Osborne    Sent: 06/26/2018  9:47 AM EDT      To: E-Visit Mailing List Subject: E-Visit Submission: CoronaVirus (COVID-19) Screening  E-Visit Submission: CoronaVirus (COVID-19) Screening --------------------------------  Question: Do you have any of the following?  Answer:   Cough  Question: Do you have any of the following additional symptoms?  Answer:   Sore throat            Headache  Question: Have you had a fever? Answer:   No  Question: Have others in your home or workplace had similar symptoms? Answer:   No  Question: When did your symptoms start? Answer:   Last night  Question: Have you recently visited any of the following countries? Answer:   None of these  Question: If you have traveled anywhere in the last  2 months please document where you have visited: Answer:     Question: Have you recently been around others from these countries or visited these countries who have had coughing or fever? Answer:   No  Question: Have you recently been around anyone who has been diagnosed with Corona virus? Answer:   No  Question: Have you been taking any medications? Answer:   Yes  Question: If taking medications for these symptoms, please list the names and whether they are helping or not Answer:   Not for these symptoms  Question: Are you treated for any of the following conditions: Asthma, COPD, Diabetes, Renal Failure (on Dialysis), AIDS, any Neuromuscular disease that  effects the clearing of secretions, Heart Failure, or Heart Disease? Answer:   No  Question: Please enter a phone number where you can be reached if we have additional questions about your symptoms Answer:   [phone]  Question: Please list your medication allergies that you may have ? (If 'none' , please list as 'none') Answer:   Penicillin  Question: Please list any additional comments  Answer:   Gave birth 4 weeks ago  Question: Are you pregnant? Answer:   I am confident that I am not pregnant  Question: Are you breastfeeding? Answer:   Yes  A total of 5-10 minutes was spent evaluating this patients questionnaire and formulating a plan of care.
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
## 2019-05-02 ENCOUNTER — Ambulatory Visit: Payer: BC Managed Care – PPO | Attending: Family

## 2019-05-02 DIAGNOSIS — Z23 Encounter for immunization: Secondary | ICD-10-CM

## 2019-05-02 NOTE — Progress Notes (Signed)
   Covid-19 Vaccination Clinic  Name:  Wendy Osborne    MRN: [id number] DOB: 29-Aug-1985  05/02/2019  [name] was observed post Covid-19 immunization for 15 minutes without incidence. She was provided with Vaccine Information Sheet and instruction to access the V-Safe system.   Ms. Stretch was instructed to call 911 with any severe reactions post vaccine: Marland Kitchen Difficulty breathing  . Swelling of your face and throat  . A fast heartbeat  . A bad rash all over your body  . Dizziness and weakness    Immunizations Administered    Name Date Dose VIS Date Route   Moderna COVID-19 Vaccine 05/02/2019 11:51 AM 0.5 mL 02/05/2019 Intramuscular   Manufacturer: Moderna   Lot: [id number]   NDC: 81859-093-11

## 2019-06-04 ENCOUNTER — Ambulatory Visit: Payer: BC Managed Care – PPO | Attending: Family

## 2019-06-04 DIAGNOSIS — Z23 Encounter for immunization: Secondary | ICD-10-CM

## 2019-06-04 NOTE — Progress Notes (Signed)
   [facility] Vaccination [facility]  Name:  Wendy Osborne    MRN: [id number] DOB: 1985-09-28  06/04/2019  [name] was observed post Covid-19 immunization for 15 minutes without incident. She was provided with Vaccine Information Sheet and instruction to access the V-Safe system.   [name] was instructed to call 911 with any severe reactions post vaccine: Marland Kitchen Difficulty breathing  . Swelling of face and throat  . A fast heartbeat  . A bad rash all over body  . Dizziness and weakness   Immunizations Administered    Name Date Dose VIS Date Route   Moderna COVID-19 Vaccine 06/04/2019  9:06 AM 0.5 mL 02/05/2019 Intramuscular   Manufacturer: Moderna   Lot: [id number]   NDC: 62703-500-93

## 2023-04-24 ENCOUNTER — Other Ambulatory Visit: Payer: Self-pay | Admitting: Obstetrics and Gynecology

## 2023-04-24 ENCOUNTER — Other Ambulatory Visit (HOSPITAL_COMMUNITY)
Admission: RE | Admit: 2023-04-24 | Discharge: 2023-04-24 | Disposition: A | Discharge status: Home / Self Care | Payer: 59 | Source: Ambulatory Visit | Attending: Obstetrics and Gynecology | Admitting: Obstetrics and Gynecology

## 2023-04-24 DIAGNOSIS — Z01419 Encounter for gynecological examination (general) (routine) without abnormal findings: Secondary | ICD-10-CM | POA: Insufficient documentation

## 2023-04-27 LAB — CYTOLOGY - PAP
Comment: NEGATIVE
Diagnosis: NEGATIVE
High risk HPV: NEGATIVE
